# Patient Record
Sex: Male | Born: 1955 | Race: White | Hispanic: No | Smoking: Former smoker
Health system: Southern US, Community
[De-identification: ages and names within clinical notes are randomized; demographics above are authoritative.]

## PROBLEM LIST (undated history)

## (undated) DIAGNOSIS — I10 Essential (primary) hypertension: Secondary | ICD-10-CM

## (undated) DIAGNOSIS — M25519 Pain in unspecified shoulder: Secondary | ICD-10-CM

## (undated) DIAGNOSIS — J449 Chronic obstructive pulmonary disease, unspecified: Secondary | ICD-10-CM

## (undated) DIAGNOSIS — G8929 Other chronic pain: Secondary | ICD-10-CM

## (undated) DIAGNOSIS — M542 Cervicalgia: Secondary | ICD-10-CM

## (undated) DIAGNOSIS — J42 Unspecified chronic bronchitis: Secondary | ICD-10-CM

## (undated) DIAGNOSIS — J439 Emphysema, unspecified: Secondary | ICD-10-CM

## (undated) HISTORY — PX: COLONOSCOPY: SHX174

## (undated) HISTORY — PX: CARDIAC CATHETERIZATION: SHX172

## (undated) HISTORY — PX: HERNIA REPAIR: SHX51

---

## 2002-02-23 ENCOUNTER — Encounter: Payer: Self-pay | Admitting: Emergency Medicine

## 2002-02-23 ENCOUNTER — Emergency Department (HOSPITAL_COMMUNITY): Admission: EM | Admit: 2002-02-23 | Discharge: 2002-02-23 | Payer: Self-pay | Admitting: Emergency Medicine

## 2006-07-09 ENCOUNTER — Emergency Department (HOSPITAL_COMMUNITY): Admission: EM | Admit: 2006-07-09 | Discharge: 2006-07-09 | Payer: Self-pay | Admitting: Emergency Medicine

## 2006-09-17 ENCOUNTER — Emergency Department (HOSPITAL_COMMUNITY): Admission: EM | Admit: 2006-09-17 | Discharge: 2006-09-18 | Payer: Self-pay | Admitting: Emergency Medicine

## 2007-11-09 ENCOUNTER — Emergency Department (HOSPITAL_COMMUNITY): Admission: EM | Admit: 2007-11-09 | Discharge: 2007-11-10 | Payer: Self-pay | Admitting: Emergency Medicine

## 2011-11-26 ENCOUNTER — Emergency Department (HOSPITAL_COMMUNITY): Payer: Self-pay

## 2011-11-26 ENCOUNTER — Encounter: Payer: Self-pay | Admitting: *Deleted

## 2011-11-26 ENCOUNTER — Emergency Department (HOSPITAL_COMMUNITY)
Admission: EM | Admit: 2011-11-26 | Discharge: 2011-11-26 | Disposition: A | Discharge status: Home / Self Care | Payer: Self-pay | Attending: Emergency Medicine | Admitting: Emergency Medicine

## 2011-11-26 DIAGNOSIS — E119 Type 2 diabetes mellitus without complications: Secondary | ICD-10-CM | POA: Insufficient documentation

## 2011-11-26 DIAGNOSIS — J4489 Other specified chronic obstructive pulmonary disease: Secondary | ICD-10-CM | POA: Insufficient documentation

## 2011-11-26 DIAGNOSIS — J40 Bronchitis, not specified as acute or chronic: Secondary | ICD-10-CM | POA: Insufficient documentation

## 2011-11-26 DIAGNOSIS — J449 Chronic obstructive pulmonary disease, unspecified: Secondary | ICD-10-CM

## 2011-11-26 DIAGNOSIS — I10 Essential (primary) hypertension: Secondary | ICD-10-CM | POA: Insufficient documentation

## 2011-11-26 HISTORY — DX: Chronic obstructive pulmonary disease, unspecified: J44.9

## 2011-11-26 HISTORY — DX: Essential (primary) hypertension: I10

## 2011-11-26 HISTORY — DX: Unspecified chronic bronchitis: J42

## 2011-11-26 LAB — GLUCOSE, CAPILLARY: Glucose-Capillary: 132 mg/dL — ABNORMAL HIGH (ref 70–99)

## 2011-11-26 MED ORDER — PREDNISONE 50 MG PO TABS: ORAL_TABLET | ORAL | Status: AC

## 2011-11-26 MED ORDER — ALBUTEROL SULFATE (5 MG/ML) 0.5% IN NEBU
5.0000 mg | INHALATION_SOLUTION | Freq: Once | RESPIRATORY_TRACT | Status: AC
  Administered 2011-11-26: 5 mg via RESPIRATORY_TRACT
  Filled 2011-11-26: qty 1

## 2011-11-26 MED ORDER — PREDNISONE 20 MG PO TABS
60.0000 mg | ORAL_TABLET | Freq: Once | ORAL | Status: AC
  Administered 2011-11-26: 60 mg via ORAL
  Filled 2011-11-26: qty 3

## 2011-11-26 MED ORDER — IPRATROPIUM BROMIDE 0.02 % IN SOLN
0.5000 mg | Freq: Once | RESPIRATORY_TRACT | Status: AC
  Administered 2011-11-26: 0.5 mg via RESPIRATORY_TRACT
  Filled 2011-11-26: qty 2.5

## 2011-11-26 NOTE — ED Provider Notes (Signed)
This chart was scribed for Joya Gaskins, MD by Wallis Mart. The patient was seen in room APA09/APA09 and the patient's care was started at 11:20.   CSN: [id number] Arrival date & time: 11/26/2011  9:41 AM   First MD Initiated Contact with Patient 11/26/11 1050      Chief Complaint  Patient presents with  . Cough    Patient is a 55 y.o. male presenting with cough. The history is provided by the patient.  Cough This is a new problem. The current episode started more than 2 days ago. The problem occurs constantly. The problem has been gradually worsening. The cough is productive of sputum. Associated symptoms include headaches, myalgias, shortness of breath and wheezing. He has tried nothing for the symptoms. His past medical history is significant for bronchitis and COPD.   Ricardo Maldonado is a 55 y.o. male who presents to the Emergency Department complaining of a gradually worsening productive cough that began 4 days ago.  Pt c/o associated chest sorenessand SOB on exertion, loss of appetite and vomiting.  Pt reports subjective on and off fever. Pt c/o generalized myalgia that began one week ago.     Past Medical History  Diagnosis Date  . Diabetes mellitus   . Hypertension   . COPD (chronic obstructive pulmonary disease)   . Bronchitis, chronic   . Emphysema     Past Surgical History  Procedure Date  . Hernia repair   . Cardiac catheterization   . Colonoscopy     History reviewed. No pertinent family history.  History  Substance Use Topics  . Smoking status: Current Everyday Smoker  . Smokeless tobacco: Not on file  . Alcohol Use: No      Review of Systems  Respiratory: Positive for cough, shortness of breath and wheezing.   Musculoskeletal: Positive for myalgias.  Neurological: Positive for headaches.  All other systems reviewed and are negative.    Allergies  Review of patient's allergies indicates no known allergies.  Home Medications   Current  Outpatient Rx  Name Route Sig Dispense Refill  . ATENOLOL 25 MG PO TABS Oral Take 25 mg by mouth daily.      . B COMPLEX-C PO TABS Oral Take 1 tablet by mouth daily.      Marland Kitchen CINNAMON 500 MG PO CAPS Oral Take 500 mg by mouth 2 (two) times daily.      Marland Kitchen COENZYME Q10 30 MG PO CAPS Oral Take 30 mg by mouth 2 (two) times daily.      . CYCLOBENZAPRINE HCL 10 MG PO TABS Oral Take 10 mg by mouth 3 (three) times daily as needed. Muscle spasms     . OMEGA-3 FATTY ACIDS 1000 MG PO CAPS Oral Take 2 g by mouth daily.      Marland Kitchen GLIPIZIDE 10 MG PO TABS Oral Take 10 mg by mouth 2 (two) times daily before a meal.      . HYDROCHLOROTHIAZIDE 25 MG PO TABS Oral Take 25 mg by mouth daily.      Marland Kitchen LORATADINE 10 MG PO TABS Oral Take 10 mg by mouth daily.      Marland Kitchen METFORMIN HCL 500 MG PO TABS Oral Take 500 mg by mouth 2 (two) times daily with a meal.      . PROMETHAZINE HCL 25 MG PO TABS Oral Take 25 mg by mouth every 6 (six) hours as needed. Nausea/vomiting     . RANITIDINE HCL 150 MG PO TABS Oral Take  150 mg by mouth 2 (two) times daily.      Marland Kitchen RESVERATROL 100 MG PO CAPS Oral Take 1 capsule by mouth 2 (two) times daily.      Marland Kitchen SIMVASTATIN 20 MG PO TABS Oral Take 10 mg by mouth at bedtime.      . TERAZOSIN HCL 5 MG PO CAPS Oral Take 5 mg by mouth at bedtime.      Marland Kitchen VITAMIN C 500 MG PO TABS Oral Take 500 mg by mouth daily.      Marland Kitchen VITAMIN E 400 UNITS PO CAPS Oral Take 400 Units by mouth daily.        BP 137/93  Pulse 76  Temp(Src) 98 F (36.7 C) (Oral)  Resp 20  Ht 6\' 4"  (1.93 m)  Wt 250 lb (113.399 kg)  BMI 30.43 kg/m2  SpO2 95%  Physical Exam CONSTITUTIONAL: Well developed/well nourished HEAD AND FACE: Normocephalic/atraumatic EYES: EOMI/PERRL ENMT: Mucous membranes moist NECK: supple no meningeal signs SPINE:entire spine nontender CV: S1/S2 noted, no murmurs/rubs/gallops noted LUNGS:  wheezing bilaterally, no tachypnea ABDOMEN: soft, nontender, no rebound or guarding GU:no cva tenderness NEURO: Pt is  awake/alert, moves all extremitiesx4 EXTREMITIES: pulses normal, full ROM, no edema, ambulates with no trouble SKIN: warm, color normal PSYCH: no abnormalities of mood noted ED Course  Procedures  DIAGNOSTIC STUDIES: Oxygen Saturation is 92% on room air, adequate by my interpretation.    COORDINATION OF CARE:     Labs Reviewed  GLUCOSE, CAPILLARY - Abnormal; Notable for the following:    Glucose-Capillary 132 (*)    All other components within normal limits   Dg Chest 2 View  11/26/2011  *RADIOLOGY REPORT*  Clinical Data: Cough, chest pain and shortness of breath.  CHEST - 2 VIEW  Comparison: None.  Findings: Trachea is midline.  Heart size normal.  Lungs are clear. No pleural fluid.  IMPRESSION: No acute findings.  Original Report Authenticated By: Reyes Ivan, M.D.     Pt had received albuterol prior to my eval He is improved, no tachypnea, sats improved I walked him around ED and no he had no dyspnea I feel he is safe for d/c home.  Likely viral process causing cough/wheezing   MDM  Nursing notes reviewed and considered in documentation xrays reviewed and considered   I personally performed the services described in this documentation, which was scribed in my presence. The recorded information has been reviewed and considered.          Joya Gaskins, MD 11/26/11 [phone]

## 2011-11-26 NOTE — ED Notes (Signed)
Pt c/o cough congestion and sob more than normal. Pt states he has had a fever that comes and goes.

## 2014-06-02 ENCOUNTER — Encounter (HOSPITAL_COMMUNITY): Payer: Self-pay | Admitting: Emergency Medicine

## 2014-06-02 ENCOUNTER — Emergency Department (HOSPITAL_COMMUNITY): Payer: Non-veteran care

## 2014-06-02 ENCOUNTER — Emergency Department (HOSPITAL_COMMUNITY)
Admission: EM | Admit: 2014-06-02 | Discharge: 2014-06-03 | Disposition: A | Discharge status: Home / Self Care | Payer: Non-veteran care | Attending: Emergency Medicine | Admitting: Emergency Medicine

## 2014-06-02 DIAGNOSIS — I10 Essential (primary) hypertension: Secondary | ICD-10-CM | POA: Insufficient documentation

## 2014-06-02 DIAGNOSIS — E119 Type 2 diabetes mellitus without complications: Secondary | ICD-10-CM | POA: Insufficient documentation

## 2014-06-02 DIAGNOSIS — Z87891 Personal history of nicotine dependence: Secondary | ICD-10-CM | POA: Insufficient documentation

## 2014-06-02 DIAGNOSIS — Z79899 Other long term (current) drug therapy: Secondary | ICD-10-CM | POA: Insufficient documentation

## 2014-06-02 DIAGNOSIS — R51 Headache: Secondary | ICD-10-CM | POA: Insufficient documentation

## 2014-06-02 DIAGNOSIS — J438 Other emphysema: Secondary | ICD-10-CM | POA: Insufficient documentation

## 2014-06-02 DIAGNOSIS — R42 Dizziness and giddiness: Secondary | ICD-10-CM | POA: Insufficient documentation

## 2014-06-02 DIAGNOSIS — R112 Nausea with vomiting, unspecified: Secondary | ICD-10-CM | POA: Insufficient documentation

## 2014-06-02 HISTORY — DX: Emphysema, unspecified: J43.9

## 2014-06-02 LAB — CBG MONITORING, ED: GLUCOSE-CAPILLARY: 162 mg/dL — AB (ref 70–99)

## 2014-06-02 MED ORDER — ALBUTEROL SULFATE (2.5 MG/3ML) 0.083% IN NEBU
2.5000 mg | INHALATION_SOLUTION | Freq: Once | RESPIRATORY_TRACT | Status: DC
  Filled 2014-06-02: qty 3

## 2014-06-02 MED ORDER — IPRATROPIUM-ALBUTEROL 0.5-2.5 (3) MG/3ML IN SOLN
3.0000 mL | Freq: Once | RESPIRATORY_TRACT | Status: AC
  Administered 2014-06-02: 3 mL via RESPIRATORY_TRACT

## 2014-06-02 MED ORDER — IPRATROPIUM BROMIDE HFA 17 MCG/ACT IN AERS: 2.0000 | INHALATION_SPRAY | Freq: Once | RESPIRATORY_TRACT | Status: DC

## 2014-06-02 MED ORDER — IPRATROPIUM-ALBUTEROL 0.5-2.5 (3) MG/3ML IN SOLN
RESPIRATORY_TRACT | Status: AC
  Filled 2014-06-02: qty 3

## 2014-06-02 MED ORDER — PROMETHAZINE HCL 12.5 MG PO TABS
25.0000 mg | ORAL_TABLET | Freq: Once | ORAL | Status: AC
  Administered 2014-06-02: 25 mg via ORAL
  Filled 2014-06-02: qty 2

## 2014-06-02 NOTE — ED Notes (Addendum)
Patient complaining of "feeling dizzy and weak" since yesterday with vomiting starting today at 1600. States "I haven't had my diabetes medicine in about a month and a half." CBG - 162 at triage.

## 2014-06-02 NOTE — ED Provider Notes (Signed)
CSN: [id number]     Arrival date & time 06/02/14  2211 History   First MD Initiated Contact with Patient 06/02/14 2241     Chief Complaint  Patient presents with  . Emesis  . Dizziness     (Consider location/radiation/quality/duration/timing/severity/associated sxs/prior Treatment) HPI Comments: Hx of nausea for several years.  Patient is a 58 y.o. male presenting with vomiting and dizziness. The history is provided by the patient.  Emesis Severity:  Moderate Duration:  6 hours Timing:  Intermittent Quality:  Stomach contents Progression:  Worsening Chronicity:  New Recent urination:  Normal Relieved by:  Nothing Worsened by:  Nothing tried Associated symptoms: headaches   Associated symptoms comment:  Sweats Risk factors: diabetes   Risk factors: no alcohol use, no sick contacts, no suspect food intake and no travel to endemic areas   Dizziness Associated symptoms: headaches and vomiting     Past Medical History  Diagnosis Date  . Diabetes mellitus   . Hypertension   . COPD (chronic obstructive pulmonary disease)   . Bronchitis, chronic   . Emphysema   . Emphysema of lung    Past Surgical History  Procedure Laterality Date  . Hernia repair    . Cardiac catheterization    . Colonoscopy     History reviewed. No pertinent family history. History  Substance Use Topics  . Smoking status: Former Games developermoker  . Smokeless tobacco: Not on file  . Alcohol Use: No    Review of Systems  Gastrointestinal: Positive for vomiting.  Neurological: Positive for dizziness and headaches.      Allergies  Review of patient's allergies indicates no known allergies.  Home Medications   Prior to Admission medications   Medication Sig Start Date End Date Taking? Authorizing Provider  atenolol (TENORMIN) 25 MG tablet Take 25 mg by mouth daily.      Historical Provider, MD  B Complex-C (B-COMPLEX WITH VITAMIN C) tablet Take 1 tablet by mouth daily.      Historical Provider, MD   Cinnamon 500 MG capsule Take 500 mg by mouth 2 (two) times daily.      Historical Provider, MD  co-enzyme Q-10 30 MG capsule Take 30 mg by mouth 2 (two) times daily.      Historical Provider, MD  cyclobenzaprine (FLEXERIL) 10 MG tablet Take 10 mg by mouth 3 (three) times daily as needed. Muscle spasms     Historical Provider, MD  fish oil-omega-3 fatty acids 1000 MG capsule Take 2 g by mouth daily.      Historical Provider, MD  glipiZIDE (GLUCOTROL) 10 MG tablet Take 10 mg by mouth 2 (two) times daily before a meal.      Historical Provider, MD  hydrochlorothiazide (HYDRODIURIL) 25 MG tablet Take 25 mg by mouth daily.      Historical Provider, MD  loratadine (CLARITIN) 10 MG tablet Take 10 mg by mouth daily.      Historical Provider, MD  metFORMIN (GLUCOPHAGE) 500 MG tablet Take 500 mg by mouth 2 (two) times daily with a meal.      Historical Provider, MD  promethazine (PHENERGAN) 25 MG tablet Take 25 mg by mouth every 6 (six) hours as needed. Nausea/vomiting     Historical Provider, MD  ranitidine (ZANTAC) 150 MG tablet Take 150 mg by mouth 2 (two) times daily.      Historical Provider, MD  RESVERATROL 100 MG CAPS Take 1 capsule by mouth 2 (two) times daily.      Historical Provider,  MD  simvastatin (ZOCOR) 20 MG tablet Take 10 mg by mouth at bedtime.      Historical Provider, MD  terazosin (HYTRIN) 5 MG capsule Take 5 mg by mouth at bedtime.      Historical Provider, MD  vitamin C (ASCORBIC ACID) 500 MG tablet Take 500 mg by mouth daily.      Historical Provider, MD  vitamin E 400 UNIT capsule Take 400 Units by mouth daily.      Historical Provider, MD   BP 160/94  Pulse 62  Temp(Src) 97.6 F (36.4 C) (Oral)  Ht 6\' 4"  (1.93 m)  Wt 245 lb (111.131 kg)  BMI 29.83 kg/m2  SpO2 97% Physical Exam  Nursing note and vitals reviewed. Constitutional: He is oriented to person, place, and time. He appears well-developed and well-nourished.  Non-toxic appearance.  HENT:  Head: Normocephalic.   Right Ear: Tympanic membrane and external ear normal.  Left Ear: Tympanic membrane and external ear normal.  Patient has difficulty hearing. He uses hearing aids.  Eyes: EOM and lids are normal. Pupils are equal, round, and reactive to light.  Neck: Normal range of motion. Neck supple. Carotid bruit is not present.  Cardiovascular: Normal rate, regular rhythm, normal heart sounds, intact distal pulses and normal pulses.   Pulmonary/Chest: Breath sounds normal. No respiratory distress.  Rhonchi and soft wheezes present.  Abdominal: Soft. Bowel sounds are normal. He exhibits no mass. There is no tenderness. There is no rebound and no guarding.  Musculoskeletal: Normal range of motion.  Lymphadenopathy:       Head (right side): No submandibular adenopathy present.       Head (left side): No submandibular adenopathy present.    He has no cervical adenopathy.  Neurological: He is alert and oriented to person, place, and time. He has normal strength. No cranial nerve deficit or sensory deficit.  Skin: Skin is warm and dry.  Psychiatric: He has a normal mood and affect. His speech is normal.    ED Course  Procedures (including critical care time) Labs Review Labs Reviewed  CBG MONITORING, ED - Abnormal; Notable for the following:    Glucose-Capillary 162 (*)    All other components within normal limits    Imaging Review No results found.   EKG Interpretation None      MDM Vital signs are within normal limits with exception of the blood pressure being elevated at 160/94. Pulse oximetry is 97% on room air. The patient had some rhonchus sounding breath sounds as well as a few soft wheezes, this was improved with albuterol Atrovent nebulizer treatment here in the department.  Comprehensive metabolic panel is well within normal limits. Complete blood count is well within normal limits. Lipase is normal. Troponin is negative for acute event. Acute abdomen x-ray shows no significant bowel  gas pattern. There is no free intra-abdominal air seen. There is a moderate amount of stool throughout the colon. There is a question of a 1.1 cm nodular density in the right midlung zone. Outpatient CT has been suggested. I discussed this with the patient. [facility], and will discuss this with them. Prescription for promethazine given to the patient.    Final diagnoses:  None    *I have reviewed nursing notes, vital signs, and all appropriate lab and imaging results for this patient.Kathie Dike**    Glendel Jaggers M Ader Fritze, PA-C 06/03/14 0124

## 2014-06-03 LAB — CBC WITH DIFFERENTIAL/PLATELET
BASOS ABS: 0 10*3/uL (ref 0.0–0.1)
BASOS PCT: 1 % (ref 0–1)
EOS PCT: 3 % (ref 0–5)
Eosinophils Absolute: 0.3 10*3/uL (ref 0.0–0.7)
HEMATOCRIT: 46.6 % (ref 39.0–52.0)
Hemoglobin: 16.9 g/dL (ref 13.0–17.0)
Lymphocytes Relative: 20 % (ref 12–46)
Lymphs Abs: 1.7 10*3/uL (ref 0.7–4.0)
MCH: 32.2 pg (ref 26.0–34.0)
MCHC: 36.3 g/dL — AB (ref 30.0–36.0)
MCV: 88.8 fL (ref 78.0–100.0)
MONO ABS: 0.7 10*3/uL (ref 0.1–1.0)
Monocytes Relative: 8 % (ref 3–12)
Neutro Abs: 5.9 10*3/uL (ref 1.7–7.7)
Neutrophils Relative %: 68 % (ref 43–77)
Platelets: 175 10*3/uL (ref 150–400)
RBC: 5.25 MIL/uL (ref 4.22–5.81)
RDW: 12.3 % (ref 11.5–15.5)
WBC: 8.6 10*3/uL (ref 4.0–10.5)

## 2014-06-03 LAB — COMPREHENSIVE METABOLIC PANEL
ALBUMIN: 4 g/dL (ref 3.5–5.2)
ALK PHOS: 60 U/L (ref 39–117)
ALT: 15 U/L (ref 0–53)
AST: 21 U/L (ref 0–37)
BILIRUBIN TOTAL: 1 mg/dL (ref 0.3–1.2)
BUN: 12 mg/dL (ref 6–23)
CHLORIDE: 97 meq/L (ref 96–112)
CO2: 26 mEq/L (ref 19–32)
Calcium: 9.2 mg/dL (ref 8.4–10.5)
Creatinine, Ser: 0.79 mg/dL (ref 0.50–1.35)
GFR calc Af Amer: >90 mL/min (ref >90)
GFR calc non Af Amer: >90 mL/min (ref >90)
Glucose, Bld: 151 mg/dL — ABNORMAL HIGH (ref 70–99)
POTASSIUM: 3.8 meq/L (ref 3.7–5.3)
Sodium: 137 mEq/L (ref 137–147)
Total Protein: 7.1 g/dL (ref 6.0–8.3)

## 2014-06-03 LAB — LIPASE, BLOOD: Lipase: 31 U/L (ref 11–59)

## 2014-06-03 LAB — TROPONIN I: Troponin I: <0.3 ng/mL (ref <0.30)

## 2014-06-03 MED ORDER — PROMETHAZINE HCL 25 MG RE SUPP: 25.0000 mg | Freq: Four times a day (QID) | RECTAL | Status: DC | PRN

## 2014-06-03 NOTE — Discharge Instructions (Signed)
Your labs are negative for acute problem. Your x-ray shows some increase stool in your colon, but no emergent or surgical issue. Please increase fluids, please use stool softener of your choice, please use promethazine for nausea. [facility] and management of your vomiting. Your chest x-ray tonight raises a question of a questionable shadow in the right lung area. The radiologist has suggested a CT scan of your chest as an outpatient. [facility]

## 2014-06-03 NOTE — ED Provider Notes (Signed)
Medical screening examination/treatment/procedure(s) were performed by non-physician practitioner and as supervising physician I was immediately available for consultation/collaboration.   EKG Interpretation   Date/Time:  Friday June 02 2014 23:42:54 EDT Ventricular Rate:  54 PR Interval:  139 QRS Duration: 102 QT Interval:  528 QTC Calculation: 500 R Axis:   -28 Text Interpretation:  Sinus rhythm Borderline left axis deviation Abnormal  R-wave progression, early transition Borderline T abnormalities, inferior  leads Prolonged QT interval No previous ECGs available Confirmed by  Bebe ShaggyWICKLINE  MD, DONALD ([id number]) on 06/02/2014 11:54:40 PM        [name], MD 06/03/14 1756

## 2016-01-22 ENCOUNTER — Ambulatory Visit (HOSPITAL_COMMUNITY): Payer: No Typology Code available for payment source | Attending: Internal Medicine | Admitting: Physical Therapy

## 2016-01-22 DIAGNOSIS — M542 Cervicalgia: Secondary | ICD-10-CM | POA: Diagnosis present

## 2016-01-22 DIAGNOSIS — M25612 Stiffness of left shoulder, not elsewhere classified: Secondary | ICD-10-CM | POA: Diagnosis present

## 2016-01-22 DIAGNOSIS — M25512 Pain in left shoulder: Secondary | ICD-10-CM

## 2016-01-22 DIAGNOSIS — R29898 Other symptoms and signs involving the musculoskeletal system: Secondary | ICD-10-CM | POA: Diagnosis present

## 2016-01-22 NOTE — Patient Instructions (Addendum)
ROM: Flexion - Wand (Supine)    Lie on back holding wand. Raise arms over head.  Repeat __10__ times per set. Do ___1_ sets per session. Do _2-3___ sessions per day.  http://orth.exer.us/928   Copyright  VHI. All rights reserved.  ROM: Abduction - Wand    Holding wand with left hand palm up, push wand directly out to side, leading with other hand palm down, until stretch is felt. Hold __5__ seconds. Repeat __10__ times per set. Do _1___ sets per session. Do __2-3__ sessions per day.  http://orth.exer.us/746   Copyright  VHI. All rights reserved.  Progressive Resisted: External Rotation (Side-Lying)    Holding __0__ pound weight, towel under arm, raise right forearm toward ceiling. Keep elbow bent and at side. Repeat ____10 times per set. Do _1___ sets per session. Do ____2-3 sessions per day.  http://orth.exer.us/878   Copyright  VHI. All rights reserved.  Scapular Retraction (Standing)    With arms at sides, pinch shoulder blades together. Repeat 10____ times per set. Do _1___ sets per session. Do2 ____ sessions per day.  http://orth.exer.us/944   Copyright  VHI. All rights reserved.  ROM: Pendulum (Flexion / Extension)    Let right arm hang and use momentum from body to swing arm forward and back. Progress from small to larger swings. Repeat _10___ times per set. Do _1___ sets per session. Do _2___ sessions per day.  http://orth.exer.us/936   Copyright  VHI. All rights reserved.  ROM: Pendulum (Side-to-Side)    Let right arm swing freely from side to side by rocking body weight from side to side. Repeat __10__ times per set. Do __1__ sets per session. Do 2____ sessions per day.  http://orth.exer.us/792   Copyright  VHI. All rights reserved.
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
## 2016-01-24 ENCOUNTER — Ambulatory Visit (HOSPITAL_COMMUNITY): Payer: No Typology Code available for payment source | Admitting: Physical Therapy

## 2016-01-24 DIAGNOSIS — R29898 Other symptoms and signs involving the musculoskeletal system: Secondary | ICD-10-CM

## 2016-01-24 DIAGNOSIS — M542 Cervicalgia: Secondary | ICD-10-CM

## 2016-01-24 DIAGNOSIS — M25612 Stiffness of left shoulder, not elsewhere classified: Secondary | ICD-10-CM

## 2016-01-24 DIAGNOSIS — M25512 Pain in left shoulder: Secondary | ICD-10-CM | POA: Diagnosis not present

## 2016-01-24 NOTE — Therapy (Signed)
[facility] [address] [place], [place], [address] Phone: [phone]   Fax:  [phone]  Physical Therapy Treatment  Patient Details  Name: Ricardo Maldonado MRN: [id number] Date of Birth: 1956/11/27 Referring Provider: Serafina Royals  Encounter Date: 01/24/2016      PT End of Session - 01/24/16 1044    Visit Number 2   Number of Visits 12   Date for PT Re-Evaluation 02/21/16   Authorization Type [facility]   PT Start Time 1020   PT Stop Time 1105   PT Time Calculation (min) 45 min   Activity Tolerance Patient limited by pain   Behavior During Therapy [facility] for tasks assessed/performed      Past Medical History  Diagnosis Date  . Diabetes mellitus   . Hypertension   . COPD (chronic obstructive pulmonary disease)   . Bronchitis, chronic   . Emphysema   . Emphysema of lung     Past Surgical History  Procedure Laterality Date  . Hernia repair    . Cardiac catheterization    . Colonoscopy      There were no vitals filed for this visit.  Visit Diagnosis:  Pain in joint of left shoulder  Shoulder stiffness, left  Shoulder weakness  Neck pain on left side      Subjective Assessment - 01/24/16 1030    Subjective Patient states that he his doing his exercises.  A little sore but has no questions.    Currently in Pain? Yes   Pain Score 8    Pain Location Shoulder   Pain Orientation Left   Pain Descriptors / Indicators Aching;Throbbing   Pain Type Chronic pain   Pain Radiating Towards elbow    Pain Onset More than a month ago   Pain Frequency Constant   Aggravating Factors  activity   Pain Relieving Factors medication   Multiple Pain Sites Yes   Pain Score 2   Pain Location Neck   Pain Descriptors / Indicators Aching   Pain Onset More than a month ago   Pain Frequency Constant   Aggravating Factors  moving    Pain Relieving Factors using the portable traction                          [facility] Adult PT  Treatment/Exercise - 01/24/16 0001    Exercises   Exercises Shoulder   Shoulder Exercises: Supine   External Rotation AROM;Left;10 reps;Weights   External Rotation Weight (lbs) 2   Internal Rotation Left;10 reps;Weights   Internal Rotation Weight (lbs) 2   Flexion AAROM;Left;10 reps  wand   ABduction AAROM;Left;10 reps  wand    Other Supine Exercises PROM for all motions    Shoulder Exercises: Seated   Retraction Strengthening;Both;10 reps   Other Seated Exercises cervical isometric exercises x 5    Shoulder Exercises: Sidelying   External Rotation Strengthening;Left;10 reps   Shoulder Exercises: Standing   Other Standing Exercises codmans x 5    Other Standing Exercises isometric for shoulder flexion, External rotation, abduction x 10 each    Shoulder Exercises: ROM/Strengthening   UBE (Upper Arm Bike) 4' backward            PT Education - 01/24/16 1105    Education provided Yes   Education Details for isometric exercises    Person(s) Educated Patient   Methods Explanation;Handout   Comprehension Returned demonstration  PT Short Term Goals - 01/24/16 1049    PT SHORT TERM GOAL #1   Title Pt pain in his shoulder to be no greater than a 5/10 to be able to lift his arm to shoulder height and perform job duties.    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    Time 3   Period Weeks   Status On-going   PT SHORT TERM GOAL #3   Title Pt active ROM for abduction to be to 90 degrees to make getting in and out of shirts easier.    Time 3   Period Weeks   Status On-going   PT SHORT TERM GOAL #4   Title Pt cervical ROM improved by 10 degres to allow safer driving    Time 3   Status On-going           PT Long Term Goals - 01/24/16 1049    PT LONG TERM  GOAL #1   Title Pt pain level to be no greater than a 3/10 to allow pt to complete all work duties including without having to rest.    Time 6   Period Weeks   Status On-going   PT LONG TERM GOAL #2   Title Pt ROM to be functional (standing sholulder flexion and abduction to be at 165) to allow pt to bath under his arm without difficulty, place items such as plates in higher cabinets without difficulty.    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    Time 6   Period Weeks   Status On-going   PT LONG TERM GOAL #4   Title Pt to be able to turn his head to the left with 20 more degrees to improve safety of driving    Time 6   Period Weeks   Status On-going   PT LONG TERM GOAL #5   Title Pt to be completing an advance home exercise to be able to obtain goals in a more expedient manner.    Time 6   Period Weeks   Status On-going               Plan - 01/24/16 1044    Clinical Impression Statement Reviewed initial evaluation and HEP with patient with no questions or concerns. Pt instructed in isometric exercises as well as sidelying ER. Began PROM to improve ROM and prevent stiffness of patient's shoulder.    Pt will benefit from skilled therapeutic intervention in order to improve on the following deficits Decreased activity tolerance;Decreased endurance;Decreased range of motion;Decreased strength;Impaired flexibility;Pain   PT Frequency 2x / week   PT Duration 6 weeks   PT Treatment/Interventions ADLs/Self Care Home Management;Electrical Stimulation;Ultrasound;Therapeutic exercise;Therapeutic activities;Functional mobility training;Patient/family education;Passive range of motion   PT Next Visit Plan Continue with PROM and progressive strengthening         Problem List There are no active problems to display for this  patient.   Virgina Organ, PT CLT [phone] 01/24/2016, 11:06 AM  [facility] [address] [place], [place], [address] Phone: [phone]   Fax:  [phone]  Name: Ricardo Maldonado MRN: [id number] Date of Birth: Dec 24, 1955

## 2016-01-24 NOTE — Patient Instructions (Signed)
Strengthening: Isometric Abduction    Using wall for resistance, press left arm into ball using light pressure. Hold _5___ seconds. Repeat __10__ times per set. Do _1___ sets per session. Do _2___ sessions per day.  http://orth.exer.us/806   Copyright  VHI. All rights reserved.  Strengthening: Isometric External Rotation    Using wall to provide resistance, and keeping right arm at side, press back of hand into ball using light pressure. Hold ____ seconds. Repeat __10__ times per set. Do _1___ sets per session. Do __2__ sessions per day.  http://orth.exer.us/814   Copyright  VHI. All rights reserved.  Strengthening: Isometric Flexion    Using wall for resistance, press right fist into ball using light pressure. Hold 5____ seconds. Repeat __10__ times per set. Do __1__ sets per session. Do ____2 sessions per day.  http://orth.exer.us/800   Copyright  VHI. All rights reserved.  Strengthening: Lateral Bend - Isometric (in Neutral)    Using light pressure from fingertips, press into right temple. Resist bending head sideways. Hold ___5_ seconds. Repeat __10__ times per set. Do ___1_ sets per session. Do _2___ sessions per day.  http://orth.exer.us/302   Copyright  VHI. All rights reserved.  Strengthening: Extension - Isometric (in Neutral)    Using light pressure from fingertips at back of head, resist bending head backward. Hold __5__ seconds. Repeat _10___ times per set. Do __1__ sets per session. Do __2__ sessions per day. 1 http://orth.exer.us/308   Copyright  VHI. All rights reserved.

## 2016-02-04 ENCOUNTER — Telehealth (HOSPITAL_COMMUNITY): Payer: Self-pay | Admitting: Physical Therapy

## 2016-02-05 ENCOUNTER — Ambulatory Visit (HOSPITAL_COMMUNITY): Payer: No Typology Code available for payment source | Admitting: Physical Therapy

## 2016-02-05 DIAGNOSIS — M25512 Pain in left shoulder: Secondary | ICD-10-CM | POA: Diagnosis not present

## 2016-02-05 DIAGNOSIS — R29898 Other symptoms and signs involving the musculoskeletal system: Secondary | ICD-10-CM

## 2016-02-05 DIAGNOSIS — M25612 Stiffness of left shoulder, not elsewhere classified: Secondary | ICD-10-CM

## 2016-02-05 DIAGNOSIS — M542 Cervicalgia: Secondary | ICD-10-CM

## 2016-02-05 NOTE — Therapy (Signed)
[facility] [address] [place], [place], [address] Phone: [phone]   Fax:  [phone]  Physical Therapy Treatment  Patient Details  Name: Ricardo Maldonado MRN: [id number] Date of Birth: 07/19/1956 Referring Provider: Serafina Royals  Encounter Date: 02/05/2016      PT End of Session - 02/05/16 1755    Visit Number 3   Number of Visits 12   Date for PT Re-Evaluation 02/21/16   Authorization Type VA   PT Start Time 1650   PT Stop Time 1735   PT Time Calculation (min) 45 min   Activity Tolerance Patient limited by pain   Behavior During Therapy [facility] for tasks assessed/performed      Past Medical History  Diagnosis Date  . Diabetes mellitus   . Hypertension   . COPD (chronic obstructive pulmonary disease)   . Bronchitis, chronic   . Emphysema   . Emphysema of lung     Past Surgical History  Procedure Laterality Date  . Hernia repair    . Cardiac catheterization    . Colonoscopy      There were no vitals filed for this visit.  Visit Diagnosis:  Pain in joint of left shoulder  Shoulder stiffness, left  Neck pain on left side  Shoulder weakness      Subjective Assessment - 02/05/16 1700    Subjective Pt states he's been working in his yard, using the hedgetrimmers and other stuff for approx 3.5 hours.  States he is currently in 8/10 pain in his Lt shoulder.    Currently in Pain? Yes   Pain Score 8    Pain Location Shoulder   Pain Orientation Left   Pain Descriptors / Indicators Aching;Throbbing                         [facility] Adult PT Treatment/Exercise - 02/05/16 1703    Shoulder Exercises: Supine   External Rotation AROM;Left;10 reps;Weights   Flexion AAROM;Left;10 reps  wand   Shoulder Flexion Weight (lbs) 3   ABduction AAROM;Left;10 reps  wand   Shoulder ABduction Weight (lbs) 3   Shoulder Exercises: Seated   Retraction Strengthening;Both;10 reps   Shoulder Exercises: Sidelying   External Rotation Strengthening;Left;10 reps   ABduction Left;5 reps   Shoulder Exercises: Pulleys   Flexion 1 minute   ABduction 1 minute   Shoulder Exercises: Therapy Ball   Flexion 10 reps   Right/Left 10 reps   Shoulder Exercises: ROM/Strengthening   UBE (Upper Arm Bike) 4' backward    Manual Therapy   Manual Therapy Scapular mobilization   Manual therapy comments Rt sidelying to Lt scap   Scapular Mobilization A/P and lateral                  PT Short Term Goals - 01/24/16 1049    PT SHORT TERM GOAL #1   Title Pt pain in his shoulder to be no greater than a 5/10 to be able to lift his arm to shoulder height and perform job duties.    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    Time 3   Period Weeks   Status On-going  PT SHORT TERM GOAL #3   Title Pt active ROM for abduction to be to 90 degrees to make getting in and out of shirts easier.    Time 3   Period Weeks   Status On-going   PT SHORT TERM GOAL #4   Title Pt cervical ROM improved by 10 degres to allow safer driving    Time 3   Status On-going           PT Long Term Goals - 01/24/16 1049    PT LONG TERM GOAL #1   Title Pt pain level to be no greater than a 3/10 to allow pt to complete all work duties including without having to rest.    Time 6   Period Weeks   Status On-going   PT LONG TERM GOAL #2   Title Pt ROM to be functional (standing sholulder flexion and abduction to be at 165) to allow pt to bath under his arm without difficulty, place items such as plates in higher cabinets without difficulty.    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    Time 6   Period Weeks   Status On-going   PT LONG TERM GOAL #4   Title Pt to be able to turn his head to the left with 20 more degrees to improve safety of driving    Time 6   Period Weeks   Status On-going   PT LONG TERM GOAL #5   Title Pt to be completing an advance home exercise to be able to obtain goals in a more expedient manner.    Time 6   Period Weeks   Status On-going               Plan - 02/05/16 1756    Clinical Impression Statement Continued focus on improving pain free ROM.  Pt tends to push self outside of pain free zone.  Discussed importance of beginning slower and building and also with the physical labor completed at home that iincreased his pain today.  Pt with pain verbalizations throughout session and sometimes would state it felt better when questioned despite the obvious pain behaviors.  Added pullies with full ROM completed and ball stretches in seated position. Scapular mobs completed in sidelying at end of session to increase comfort.  Pt reported overall improvment with reduced symptoms at end of session by 2 levels.     Pt will benefit from skilled therapeutic intervention in order to improve on the following deficits Decreased activity tolerance;Decreased endurance;Decreased range of motion;Decreased strength;Impaired flexibility;Pain   PT Frequency 2x / week   PT Duration 6 weeks   PT Treatment/Interventions ADLs/Self Care Home Management;Electrical Stimulation;Ultrasound;Therapeutic exercise;Therapeutic activities;Functional mobility training;Patient/family education;Passive range of motion   PT Next Visit Plan Continue with PROM and progressive strengthening         Problem List There are no active problems to display for this patient.   Lurena Nida, PTA/CLT [phone]  02/05/2016, 6:00 PM  [facility] [address] [place], [place], [address] Phone: [phone]   Fax:   [phone]  Name: Ricardo Maldonado MRN: [id number] Date of Birth: [birth year]

## 2016-02-07 ENCOUNTER — Ambulatory Visit (HOSPITAL_COMMUNITY): Payer: No Typology Code available for payment source | Admitting: Physical Therapy

## 2016-02-07 DIAGNOSIS — M25512 Pain in left shoulder: Secondary | ICD-10-CM | POA: Diagnosis not present

## 2016-02-07 DIAGNOSIS — R29898 Other symptoms and signs involving the musculoskeletal system: Secondary | ICD-10-CM

## 2016-02-07 DIAGNOSIS — M25612 Stiffness of left shoulder, not elsewhere classified: Secondary | ICD-10-CM

## 2016-02-07 DIAGNOSIS — M542 Cervicalgia: Secondary | ICD-10-CM

## 2016-02-07 NOTE — Therapy (Signed)
[facility] [address] [place], [place], [address] Phone: [phone]   Fax:  [phone]  Physical Therapy Treatment  Patient Details  Name: Ricardo Maldonado MRN: [id number] Date of Birth: 1956/02/11 Referring Provider: Serafina Royals  Encounter Date: 02/07/2016      PT End of Session - 02/07/16 1821    Visit Number 4   Number of Visits 12   Date for PT Re-Evaluation 02/21/16   Authorization Type VA   PT Start Time 1600   PT Stop Time 1645   PT Time Calculation (min) 45 min   Activity Tolerance Patient limited by pain   Behavior During Therapy [facility] for tasks assessed/performed      Past Medical History  Diagnosis Date  . Diabetes mellitus   . Hypertension   . COPD (chronic obstructive pulmonary disease)   . Bronchitis, chronic   . Emphysema   . Emphysema of lung     Past Surgical History  Procedure Laterality Date  . Hernia repair    . Cardiac catheterization    . Colonoscopy      There were no vitals filed for this visit.  Visit Diagnosis:  Pain in joint of left shoulder  Shoulder stiffness, left  Shoulder weakness  Neck pain on left side      Subjective Assessment - 02/07/16 1611    Subjective Pt states his pain is worse first thing in the morning, 9-10/10 but goes down slightly during the day.  Currently 8/10; states he took it easy today.    Currently in Pain? Yes   Pain Score 8    Pain Location Shoulder   Pain Orientation Left   Pain Descriptors / Indicators Aching;Throbbing   Pain Type Chronic pain                         [facility] Adult PT Treatment/Exercise - 02/07/16 1618    Shoulder Exercises: Supine   Internal Rotation Left;10 reps;Weights   Internal Rotation Weight (lbs) 2   Flexion Left;10 reps;AROM   Flexion Limitations AROM without wand, Lt only   Shoulder Exercises: Seated   Retraction Strengthening;Both;10 reps   Flexion 10 reps   Flexion Limitations against wall   Shoulder Exercises: Sidelying   External Rotation Strengthening;Left;10 reps   External Rotation Weight (lbs) 3   ABduction Left;10 reps   Shoulder Exercises: Pulleys   Flexion 2 minutes   ABduction 2 minutes   Shoulder Exercises: Therapy Ball   Flexion 10 reps   Right/Left 10 reps   Shoulder Exercises: ROM/Strengthening   UBE (Upper Arm Bike) 4' backward    Shoulder Exercises: Stretch   Corner Stretch 3 reps;20 seconds   Manual Therapy   Manual Therapy Scapular mobilization   Manual therapy comments Rt sidelying to Lt scap   Scapular Mobilization A/P and lateral                  PT Short Term Goals - 01/24/16 1049    PT SHORT TERM GOAL #1   Title Pt pain in his shoulder to be no greater than a 5/10 to be able to lift his arm to shoulder height and perform job duties.    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    Time 3   Period Weeks   Status On-going   PT SHORT TERM GOAL #3   Title Pt active ROM for abduction to be to 90 degrees to make getting in and out of shirts easier.    Time 3   Period Weeks   Status On-going   PT SHORT TERM GOAL #4   Title Pt cervical ROM improved by 10 degres to allow safer driving    Time 3   Status On-going           PT Long Term Goals - 01/24/16 1049    PT LONG TERM GOAL #1   Title Pt pain level to be no greater than a 3/10 to allow pt to complete all work duties including without having to rest.    Time 6   Period Weeks   Status On-going   PT LONG TERM GOAL #2   Title Pt ROM to be functional (standing sholulder flexion and abduction to be at 165) to allow pt to bath under his arm without difficulty, place items such as plates in higher cabinets without difficulty.    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    Time 6   Period Weeks   Status On-going   PT LONG TERM GOAL #4   Title Pt to be able to turn his head to the left with 20 more degrees to improve safety of driving    Time 6   Period Weeks   Status On-going   PT LONG TERM GOAL #5   Title Pt to be completing an advance home exercise to be able to obtain goals in a more expedient manner.    Time 6   Period Weeks   Status On-going               Plan - 02/07/16 1821    Clinical Impression Statement Focused session on advancing to Gravity resisted AROM today.  Added standing UE flexion and abuction wtihout diffiuclty.  discontinued wand in supine and began AROM for flexion and abduction and addition of 3#weight for ER.  pt able to complete all without c/o pain.  Pt now presents with full ROM and is ready to advance to strengthening exercises.   Scapular mobs completed with good mobility as well.  Tightness in upper trap region on Lt noted.    Pt will benefit from skilled therapeutic intervention in order to improve on the following deficits Decreased activity tolerance;Decreased endurance;Decreased range of motion;Decreased strength;Impaired flexibility;Pain   PT Frequency 2x / week   PT Duration 6 weeks   PT Treatment/Interventions ADLs/Self Care Home Management;Electrical Stimulation;Ultrasound;Therapeutic exercise;Therapeutic activities;Functional mobility training;Patient/family education;Passive range of motion   PT Next Visit Plan Continue with AROM and progressive strengthening of UE.         Problem List There are no active problems to display for this patient.   Lurena Nida, PTA/CLT [phone]  02/07/2016, 6:25 PM  [facility] [address] [place], [place], [address] Phone: [phone]   Fax:  [phone]  Name: Ricardo Maldonado MRN: [id number] Date of Birth: 05-31-1956

## 2016-02-12 ENCOUNTER — Ambulatory Visit (HOSPITAL_COMMUNITY): Payer: No Typology Code available for payment source

## 2016-02-14 ENCOUNTER — Ambulatory Visit (HOSPITAL_COMMUNITY): Payer: No Typology Code available for payment source | Admitting: Physical Therapy

## 2016-02-19 ENCOUNTER — Ambulatory Visit (HOSPITAL_COMMUNITY): Payer: No Typology Code available for payment source | Attending: Internal Medicine | Admitting: Physical Therapy

## 2016-02-19 DIAGNOSIS — M25612 Stiffness of left shoulder, not elsewhere classified: Secondary | ICD-10-CM | POA: Insufficient documentation

## 2016-02-19 DIAGNOSIS — M542 Cervicalgia: Secondary | ICD-10-CM | POA: Diagnosis present

## 2016-02-19 DIAGNOSIS — R29898 Other symptoms and signs involving the musculoskeletal system: Secondary | ICD-10-CM | POA: Insufficient documentation

## 2016-02-19 DIAGNOSIS — M25512 Pain in left shoulder: Secondary | ICD-10-CM | POA: Diagnosis not present

## 2016-02-19 NOTE — Therapy (Signed)
[facility] [address] [place], [place], [address] Phone: [phone]   Fax:  [phone]  Physical Therapy Treatment  Patient Details  Name: Ricardo Maldonado MRN: [id number] Date of Birth: 01-17-1956 Referring Provider: Serafina Royals  Encounter Date: 02/19/2016      PT End of Session - 02/19/16 1041    Visit Number 5   Number of Visits 12   Date for PT Re-Evaluation 02/21/16   Authorization Type VA   PT Start Time 1020   PT Stop Time 1100   PT Time Calculation (min) 40 min   Activity Tolerance Patient limited by pain   Behavior During Therapy [facility] for tasks assessed/performed      Past Medical History  Diagnosis Date  . Diabetes mellitus   . Hypertension   . COPD (chronic obstructive pulmonary disease)   . Bronchitis, chronic   . Emphysema   . Emphysema of lung     Past Surgical History  Procedure Laterality Date  . Hernia repair    . Cardiac catheterization    . Colonoscopy      There were no vitals filed for this visit.  Visit Diagnosis:  Pain in joint of left shoulder  Shoulder stiffness, left  Shoulder weakness  Neck pain on left side      Subjective Assessment - 02/19/16 1024    Subjective Pt states he didn't sleep much last night due to the pain in his shoulder and neck. Pain is 7/10 upon arrival, but improves with movement.    Currently in Pain? Yes   Pain Score 7    Pain Location Neck   Pain Orientation Left   Pain Descriptors / Indicators Aching;Throbbing   Pain Type Chronic pain   Pain Radiating Towards deltoid region    Pain Onset More than a month ago   Pain Frequency Constant   Aggravating Factors  activity, LUE elevation   Pain Relieving Factors PT alleviates some of the pain for several hours    Effect of Pain on Daily Activities all over head activities                         [facility] Adult PT Treatment/Exercise - 02/19/16 1038    Shoulder Exercises: Supine   Internal  Rotation Left;10 reps;Weights   Internal Rotation Weight (lbs) 2   Flexion Left;10 reps;AROM   Flexion Limitations AROM without wand, Lt only   Shoulder Exercises: Pulleys   Flexion 2 minutes   ABduction 2 minutes   Shoulder Exercises: Therapy Ball   Flexion 10 reps   Right/Left 10 reps   Shoulder Exercises: ROM/Strengthening   UBE (Upper Arm Bike) 4' backward                 PT Education - 02/19/16 1056    Education provided Yes   Education Details Completing therex in decreased pain as far as ROM and pushing to extreme pain.   Person(s) Educated Patient   Methods Explanation   Comprehension Verbalized understanding          PT Short Term Goals - 01/24/16 1049    PT SHORT TERM GOAL #1   Title Pt pain in his shoulder to be no greater than a 5/10 to be able to lift his arm to shoulder height and perform job duties.    Time 3   Period Weeks   Status On-going   PT SHORT TERM GOAL #2  Title Pt strength to be improved  1/2 grade to be able to lift five pounds to shoulder height to be able to put a gallon of milk into the refridgerator and various work activites.    Time 3   Period Weeks   Status On-going   PT SHORT TERM GOAL #3   Title Pt active ROM for abduction to be to 90 degrees to make getting in and out of shirts easier.    Time 3   Period Weeks   Status On-going   PT SHORT TERM GOAL #4   Title Pt cervical ROM improved by 10 degres to allow safer driving    Time 3   Status On-going           PT Long Term Goals - 01/24/16 1049    PT LONG TERM GOAL #1   Title Pt pain level to be no greater than a 3/10 to allow pt to complete all work duties including without having to rest.    Time 6   Period Weeks   Status On-going   PT LONG TERM GOAL #2   Title Pt ROM to be functional (standing sholulder flexion and abduction to be at 165) to allow pt to bath under his arm without difficulty, place items such as plates in higher cabinets without difficulty.     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    Time 6   Period Weeks   Status On-going   PT LONG TERM GOAL #4   Title Pt to be able to turn his head to the left with 20 more degrees to improve safety of driving    Time 6   Period Weeks   Status On-going   PT LONG TERM GOAL #5   Title Pt to be completing an advance home exercise to be able to obtain goals in a more expedient manner.    Time 6   Period Weeks   Status On-going               Plan - 02/19/16 1041    Clinical Impression Statement Pt has not had therapy in over 1 week due to appt unavailable and patient getting appt mixed up.  STates he was suppose to return to [place] yesterday but had to cancel due to stomach virus.  Pt reports no change in symptoms (no better/no worse).  Reports compliance with HEP.  Has extreme pain behaviors throughout session and insists on working through the pain when therapist instucts to stop activity.  Pt at one point cried out with shoulder abduction and pain behaviors escallated remainder of session and unable to complete further exercise. Educated patient on staying in painfree ROM and not to push self to 15/10 pain.   Pt was tearful and pain of 10/10.  PT returns for re-eval next session.   Pt will benefit from skilled therapeutic intervention in order to improve on the following deficits Decreased activity tolerance;Decreased endurance;Decreased range of motion;Decreased strength;Impaired flexibility;Pain   PT Frequency 2x / week   PT Duration 6 weeks   PT Treatment/Interventions ADLs/Self Care Home Management;Electrical Stimulation;Ultrasound;Therapeutic exercise;Therapeutic activities;Functional mobility training;Patient/family education;Passive range of motion   PT Next Visit Plan Continue with AROM and progressive strengthening of UE. Re-evaluate  next session.  Problem List There are no active problems to display for this patient.  Lurena Nidamy B Tuvia Woodrick, PTA/CLT [phone] 02/19/2016, 11:15 AM  [facility] [address] [place], [place], [address] Phone: [phone]   Fax:  [phone]  Name: Ricardo Maldonado MRN: [id number] Date of Birth: 12/17/1955

## 2016-02-21 ENCOUNTER — Ambulatory Visit (HOSPITAL_COMMUNITY): Payer: No Typology Code available for payment source | Admitting: Physical Therapy

## 2016-02-21 DIAGNOSIS — M25612 Stiffness of left shoulder, not elsewhere classified: Secondary | ICD-10-CM

## 2016-02-21 DIAGNOSIS — M25512 Pain in left shoulder: Secondary | ICD-10-CM | POA: Diagnosis not present

## 2016-02-21 DIAGNOSIS — R29898 Other symptoms and signs involving the musculoskeletal system: Secondary | ICD-10-CM

## 2016-02-21 DIAGNOSIS — M542 Cervicalgia: Secondary | ICD-10-CM
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
## 2016-02-21 NOTE — Patient Instructions (Signed)
External Rotation (Prone)    Lie with right upper arm straight out from body, elbow bent to 90, _2___ pound weight in hand. Rotate forearm up, keeping elbow bent. Return slowly. Repeat __10__ times per set. Do __1__ sets per session. Do ___2 http://orth.exer.us/940   Copyright  VHI. All rights reserved.  Strengthening: Resisted Abduction    Hold tubing with right arm across body. Pull up and away from side. Move through pain-free range of motion. Repeat _10___ times per set. Do _1___ sets per session. Do __2__ sessions per day.  http://orth.exer.us/826   Copyright  VHI. All rights reserved.  Strengthening: Resisted External Rotation    Hold tubing in right hand, elbow at side and forearm across body. Rotate forearm out. Repeat 10____ times per set. Do __1__ sets per session. Do ___2_ sessions per day.  http://orth.exer.us/828   Copyright  VHI. All rights reserved.  Strengthening: Resisted Internal Rotation    Hold tubing in left hand, elbow at side and forearm out. Rotate forearm in across body. Repeat _10___ times per set. Do __1__ sets per session. Do _2___ sessions per day.  http://orth.exer.us/830   Copyright  VHI. All rights reserved.  Strengthening: Resisted Flexion    Hold tubing with left arm at side. Pull forward and up. Move shoulder through pain-free range of motion. Repeat 10____ times per set. Do _1___ sets per session. Do _2___ sessions per day.  http://orth.exer.us/824   Copyright  VHI. All rights reserved.

## 2016-02-26 ENCOUNTER — Ambulatory Visit (HOSPITAL_COMMUNITY): Payer: No Typology Code available for payment source | Admitting: Physical Therapy

## 2016-02-26 DIAGNOSIS — M542 Cervicalgia: Secondary | ICD-10-CM

## 2016-02-26 DIAGNOSIS — M25512 Pain in left shoulder: Secondary | ICD-10-CM

## 2016-02-26 DIAGNOSIS — R29898 Other symptoms and signs involving the musculoskeletal system: Secondary | ICD-10-CM

## 2016-02-26 DIAGNOSIS — M25612 Stiffness of left shoulder, not elsewhere classified: Secondary | ICD-10-CM
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
## 2016-02-28 ENCOUNTER — Ambulatory Visit (HOSPITAL_COMMUNITY): Payer: No Typology Code available for payment source | Admitting: Physical Therapy

## 2016-02-28 DIAGNOSIS — M542 Cervicalgia: Secondary | ICD-10-CM

## 2016-02-28 DIAGNOSIS — M25512 Pain in left shoulder: Secondary | ICD-10-CM | POA: Diagnosis not present

## 2016-02-28 DIAGNOSIS — M25612 Stiffness of left shoulder, not elsewhere classified: Secondary | ICD-10-CM

## 2016-02-28 DIAGNOSIS — R29898 Other symptoms and signs involving the musculoskeletal system: Secondary | ICD-10-CM

## 2016-02-28 NOTE — Therapy (Signed)
[place] [address] [place], [place], [address] Phone: [phone]   Fax:  [phone]  Physical Therapy Treatment  Patient Details  Name: Ricardo Maldonado MRN: [id number] Date of Birth: 05/21/1956 Referring Provider: Dahlia Byes  Encounter Date: 02/28/2016      PT End of Session - 02/28/16 1152    Visit Number 8   Number of Visits 12   Date for PT Re-Evaluation 03/22/16   Authorization Type VA-G code needed on 10th visit    PT Start Time 1101   PT Stop Time 1147   PT Time Calculation (min) 46 min   Activity Tolerance Patient tolerated treatment well   Behavior During Therapy [facility] for tasks assessed/performed      Past Medical History  Diagnosis Date  . Diabetes mellitus   . Hypertension   . COPD (chronic obstructive pulmonary disease)   . Bronchitis, chronic   . Emphysema   . Emphysema of lung     Past Surgical History  Procedure Laterality Date  . Hernia repair    . Cardiac catheterization    . Colonoscopy      There were no vitals filed for this visit.  Visit Diagnosis:  Pain in joint of left shoulder  Shoulder stiffness, left  Shoulder weakness  Neck pain on left side      Subjective Assessment - 02/28/16 1104    Subjective My shoulder, neck, and back have been hurting worse. Seems to have gotten worse since hurting his shoulder. Saw the doctor and the plan is to wait to decide anything until after PT is done. Might be getting an appointment with an orthopedic surgeon sometime in the future. Back got hurt climbing on a ladder on Sunday too.    Currently in Pain? Yes   Pain Score 8    Pain Location Shoulder   Pain Orientation Left   Pain Descriptors / Indicators Sharp   Pain Type Chronic pain   Pain Onset More than a month ago   Pain Frequency Constant   Aggravating Factors  trying to use the left arm, raising up the Lt arm, reaching overhead.    Pain Relieving Factors 1 hour after therapy.                           [place] Adult PT Treatment/Exercise - 02/28/16 0001    Shoulder Exercises: Prone   Retraction Strengthening;Left;10 reps;Weights   Retraction Weight (lbs) 2   Extension Strengthening;Left;10 reps   External Rotation Strengthening;Left;10 reps   Horizontal ABduction 1 Strengthening;Left;10 reps  pt limiting range due to reports of pain   Shoulder Exercises: Sidelying   External Rotation Strengthening;10 reps  2 sets   External Rotation Weight (lbs) 2   Shoulder Exercises: Standing   External Rotation Strengthening;Left;10 reps;Other (comment)  2X10   Theraband Level (Shoulder External Rotation) Level 3 (Green)   Internal Rotation Strengthening;Left;10 reps;Theraband  2 x10   Theraband Level (Shoulder Internal Rotation) Level 3 (Green)   Flexion Strengthening;Left;10 reps   Theraband Level (Shoulder Flexion) Other (comment)  2 X 10 , no resistance   Other Standing Exercises reaching (1000g ball) shoulder to crown height on shelves 2X10   Shoulder Exercises: Pulleys   Flexion 2 minutes   ABduction 2 minutes   Shoulder Exercises: ROM/Strengthening   UBE (Upper Arm Bike) 6 minutes level 3  PT Education - 02/28/16 1150    Education provided Yes   Education Details reviewing exercise technique, avoiding compensations. Discussing strengthening in available range without increasing pain.    Person(s) Educated Patient   Methods Explanation;Demonstration;Tactile cues;Verbal cues   Comprehension Verbalized understanding;Returned demonstration;Need further instruction          PT Short Term Goals - 02/21/16 1149    PT SHORT TERM GOAL #1   Title Pt pain in his shoulder to be no greater than a 5/10 to be able to lift his arm to shoulder height and perform job duties.    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    Time 3   Period Weeks   Status Achieved   PT SHORT TERM GOAL #3   Title Pt active ROM for abduction to be to 90 degrees to make getting in and out of shirts easier.    Time 3   Period Weeks   Status Achieved   PT SHORT TERM GOAL #4   Title Pt cervical ROM improved by 10 degres to allow safer driving    Time 3   Period Weeks   Status Achieved   PT SHORT TERM GOAL #5   Title Pt to be completing a home exercise program to obtain goal.    Time 3   Period Weeks   Status Achieved           PT Long Term Goals - 02/21/16 1150    PT LONG TERM GOAL #1   Title Pt pain level to be no greater than a 3/10 to allow pt to complete all work duties including without having to rest.    Time 6   Period Weeks   Status Not Met   PT LONG TERM GOAL #2   Title Pt ROM to be functional (standing sholulder flexion and abduction to be at 165) to allow pt to bath under his arm without difficulty, place items such as plates in higher cabinets without difficulty.    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    Baseline Has the strength but still states that this activity is difficult   Time 6   Period Weeks   Status On-going   PT LONG TERM GOAL #4   Title Pt to be able to turn his head to the left with 20 more degrees to improve safety of driving    Time 6   Period Weeks   Status Achieved   PT LONG TERM GOAL #5   Title Pt to be completing an advance home exercise to be able to obtain goals in a more expedient manner.    Time 6   Period Weeks   Status Achieved               Plan - 02/28/16 1153    Clinical  Impression Statement  Session focusing on shoulder complex ROM and strengthening, including rotator cuff and scapular stabilization. Discussing with patient to stay in painfree range with strengthening (patient's demonstration of painfree range was noted to vary during session). By end of session the patient stated that he feel better not that it's all loosened up. Pt denies any qustions or concerns.    PT Next Visit Plan continue with scapular and rotator cuff stabilization exercises.    PT Home Exercise Plan modify as needed    Consulted and Agree with Plan of Care Patient        Problem List There are no active problems to display for this patient.   Cassell Clement, PT, CSCS Pager [phone]  02/28/2016, 11:58 AM  [place], [place], [address] Phone: [phone]   Fax:  [phone]  Name: Ricardo Maldonado MRN: [id number] Date of Birth: Feb 04, 1956

## 2016-03-04 ENCOUNTER — Ambulatory Visit (HOSPITAL_COMMUNITY): Payer: No Typology Code available for payment source | Admitting: Physical Therapy

## 2016-03-04 DIAGNOSIS — M25612 Stiffness of left shoulder, not elsewhere classified: Secondary | ICD-10-CM

## 2016-03-04 DIAGNOSIS — M542 Cervicalgia: Secondary | ICD-10-CM

## 2016-03-04 DIAGNOSIS — R29898 Other symptoms and signs involving the musculoskeletal system: Secondary | ICD-10-CM

## 2016-03-04 DIAGNOSIS — M25512 Pain in left shoulder: Secondary | ICD-10-CM

## 2016-03-04 NOTE — Therapy (Signed)
[place], [place], [address] Phone: [phone]   Fax:  [phone]  Physical Therapy Treatment  Patient Details  Name: Ricardo Maldonado MRN: [id number] Date of Birth: 09-Dec-1956 Referring Provider: Dahlia Byes  Encounter Date: 03/04/2016      PT End of Session - 03/04/16 1043    Visit Number 9   Number of Visits 12   Date for PT Re-Evaluation 03/22/16   Authorization Type VA-G code needed on 10th visit    PT Start Time 1015   PT Stop Time 1055   PT Time Calculation (min) 40 min   Activity Tolerance Patient tolerated treatment well   Behavior During Therapy [facility] for tasks assessed/performed      Past Medical History  Diagnosis Date  . Diabetes mellitus   . Hypertension   . COPD (chronic obstructive pulmonary disease)   . Bronchitis, chronic   . Emphysema   . Emphysema of lung     Past Surgical History  Procedure Laterality Date  . Hernia repair    . Cardiac catheterization    . Colonoscopy      There were no vitals filed for this visit.  Visit Diagnosis:  Pain in joint of left shoulder  Shoulder stiffness, left  Shoulder weakness  Neck pain on left side      Subjective Assessment - 03/04/16 1017    Subjective Pt states that he is doing his exercieses at home    Currently in Pain? Yes   Pain Score 7    Pain Location Shoulder   Pain Orientation Left   Pain Descriptors / Indicators Aching   Pain Type Chronic pain   Pain Radiating Towards deltoid   Pain Onset More than a month ago   Pain Frequency Constant   Aggravating Factors  activity    Pain Relieving Factors ice    Pain Score 9   Pain Location Neck   Pain Orientation Left   Pain Descriptors / Indicators Aching   Pain Onset More than a month ago   Pain Frequency Constant   Aggravating Factors  activity    Pain Relieving Factors portable traction                          [facility] Adult PT Treatment/Exercise  - 03/04/16 0001    Exercises   Exercises Shoulder   Shoulder Exercises: Standing   Horizontal ABduction Left;15 reps;Theraband   Theraband Level (Shoulder Horizontal ABduction) Level 3 (Green)   External Rotation Strengthening;Left;15 reps;Theraband   Theraband Level (Shoulder External Rotation) Level 3 (Green)   Internal Rotation Strengthening;15 reps;Theraband   Theraband Level (Shoulder Internal Rotation) Level 3 (Green)   Flexion Strengthening;Left;15 reps;Theraband   Theraband Level (Shoulder Flexion) Level 3 (Green)   ABduction Strengthening;Left;15 reps;Theraband   Theraband Level (Shoulder ABduction) Level 3 (Green)   Extension Strengthening;Left;15 reps   Theraband Level (Shoulder Extension) Level 3 (Green)   Other Standing Exercises wall washing x 2' ; bicep/ Tricep with elbows to ceiling with 3# x 10 reps    Other Standing Exercises placing objects in second shelf with Left arm ( 1-3#) 3 objects x 4    Shoulder Exercises: ROM/Strengthening   UBE (Upper Arm Bike) 8 minutes level 3    Wall Pushups 5 reps   Plank --  15 seconds x 3 reps    Shoulder Exercises: Stretch   Other Shoulder Stretches posterior capsule stretch; child  pose stretch                   PT Short Term Goals - 02/21/16 1149    PT SHORT TERM GOAL #1   Title Pt pain in his shoulder to be no greater than a 5/10 to be able to lift his arm to shoulder height and perform job duties.    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    Time 3   Period Weeks   Status Achieved   PT SHORT TERM GOAL #3   Title Pt active ROM for abduction to be to 90 degrees to make getting in and out of shirts easier.    Time 3   Period Weeks   Status Achieved   PT SHORT TERM GOAL #4   Title Pt cervical ROM improved by  10 degres to allow safer driving    Time 3   Period Weeks   Status Achieved   PT SHORT TERM GOAL #5   Title Pt to be completing a home exercise program to obtain goal.    Time 3   Period Weeks   Status Achieved           PT Long Term Goals - 02/21/16 1150    PT LONG TERM GOAL #1   Title Pt pain level to be no greater than a 3/10 to allow pt to complete all work duties including without having to rest.    Time 6   Period Weeks   Status Not Met   PT LONG TERM GOAL #2   Title Pt ROM to be functional (standing sholulder flexion and abduction to be at 165) to allow pt to bath under his arm without difficulty, place items such as plates in higher cabinets without difficulty.    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    Baseline Has the strength but still states that this activity is difficult   Time 6   Period Weeks   Status On-going   PT LONG TERM GOAL #4   Title Pt to be able to turn his head to the left with 20 more degrees to improve safety of driving    Time 6   Period Weeks   Status Achieved   PT LONG TERM GOAL #5   Title Pt to be completing an advance home exercise to be able to obtain goals in a more expedient manner.    Time 6   Period Weeks   Status Achieved               Plan - 03/04/16 1044    Clinical Impression Statement Added plank, wall push ups and bicep and tricep work to this session with verbal cuing needed for proper form.  Pt continues to have pain behavior during exercises that are diminished with distraction.    Pt  will benefit from skilled therapeutic intervention in order to improve on the following deficits Decreased activity tolerance;Decreased  endurance;Decreased range of motion;Decreased strength;Impaired flexibility;Pain   Rehab Potential Fair   PT Frequency 2x / week   PT Duration 6 weeks   PT Treatment/Interventions ADLs/Self Care Home Management;Electrical Stimulation;Ultrasound;Therapeutic exercise;Therapeutic activities;Functional mobility training;Patient/family education;Passive range of motion   PT Next Visit Plan continue with scapular and rotator cuff stabilization exercises.         Problem List There are no active problems to display for this patient.   Rayetta Humphrey, PT CLT [phone] 03/04/2016, 10:55 AM  [place], [place], [address] Phone: [phone]   Fax:  [phone]  Name: Ricardo Maldonado MRN: [id number] Date of Birth: 09/10/1956

## 2016-03-06 ENCOUNTER — Ambulatory Visit (HOSPITAL_COMMUNITY): Payer: No Typology Code available for payment source | Admitting: Physical Therapy

## 2016-03-06 DIAGNOSIS — M542 Cervicalgia: Secondary | ICD-10-CM

## 2016-03-06 DIAGNOSIS — R29898 Other symptoms and signs involving the musculoskeletal system: Secondary | ICD-10-CM

## 2016-03-06 DIAGNOSIS — M25612 Stiffness of left shoulder, not elsewhere classified: Secondary | ICD-10-CM

## 2016-03-06 DIAGNOSIS — M25512 Pain in left shoulder: Secondary | ICD-10-CM
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
## 2017-07-07 ENCOUNTER — Other Ambulatory Visit (HOSPITAL_COMMUNITY): Payer: Self-pay | Admitting: Physician Assistant

## 2017-07-07 DIAGNOSIS — M79602 Pain in left arm: Secondary | ICD-10-CM

## 2017-07-07 DIAGNOSIS — M25512 Pain in left shoulder: Secondary | ICD-10-CM

## 2017-07-15 ENCOUNTER — Ambulatory Visit (HOSPITAL_COMMUNITY)
Admission: RE | Admit: 2017-07-15 | Discharge: 2017-07-15 | Disposition: A | Discharge status: Home / Self Care | Payer: Non-veteran care | Source: Ambulatory Visit | Attending: Physician Assistant | Admitting: Physician Assistant

## 2017-07-15 DIAGNOSIS — M4802 Spinal stenosis, cervical region: Secondary | ICD-10-CM | POA: Insufficient documentation

## 2017-07-15 DIAGNOSIS — M19012 Primary osteoarthritis, left shoulder: Secondary | ICD-10-CM | POA: Insufficient documentation

## 2017-07-15 DIAGNOSIS — M47812 Spondylosis without myelopathy or radiculopathy, cervical region: Secondary | ICD-10-CM | POA: Insufficient documentation

## 2017-07-15 DIAGNOSIS — M25512 Pain in left shoulder: Secondary | ICD-10-CM | POA: Insufficient documentation

## 2017-07-15 DIAGNOSIS — M75102 Unspecified rotator cuff tear or rupture of left shoulder, not specified as traumatic: Secondary | ICD-10-CM | POA: Insufficient documentation

## 2017-07-15 DIAGNOSIS — M79602 Pain in left arm: Secondary | ICD-10-CM | POA: Diagnosis present

## 2017-12-08 ENCOUNTER — Emergency Department (HOSPITAL_COMMUNITY)
Admission: EM | Admit: 2017-12-08 | Discharge: 2017-12-08 | Disposition: A | Discharge status: Home / Self Care | Payer: Non-veteran care | Attending: Emergency Medicine | Admitting: Emergency Medicine

## 2017-12-08 ENCOUNTER — Emergency Department (HOSPITAL_COMMUNITY): Payer: Non-veteran care

## 2017-12-08 ENCOUNTER — Encounter (HOSPITAL_COMMUNITY): Payer: Self-pay | Admitting: Emergency Medicine

## 2017-12-08 DIAGNOSIS — Z87891 Personal history of nicotine dependence: Secondary | ICD-10-CM | POA: Insufficient documentation

## 2017-12-08 DIAGNOSIS — Z79899 Other long term (current) drug therapy: Secondary | ICD-10-CM | POA: Insufficient documentation

## 2017-12-08 DIAGNOSIS — E119 Type 2 diabetes mellitus without complications: Secondary | ICD-10-CM | POA: Insufficient documentation

## 2017-12-08 DIAGNOSIS — G8929 Other chronic pain: Secondary | ICD-10-CM | POA: Insufficient documentation

## 2017-12-08 DIAGNOSIS — M5441 Lumbago with sciatica, right side: Secondary | ICD-10-CM

## 2017-12-08 DIAGNOSIS — M545 Low back pain: Secondary | ICD-10-CM | POA: Diagnosis present

## 2017-12-08 DIAGNOSIS — Z7984 Long term (current) use of oral hypoglycemic drugs: Secondary | ICD-10-CM | POA: Diagnosis not present

## 2017-12-08 DIAGNOSIS — J449 Chronic obstructive pulmonary disease, unspecified: Secondary | ICD-10-CM | POA: Insufficient documentation

## 2017-12-08 DIAGNOSIS — J42 Unspecified chronic bronchitis: Secondary | ICD-10-CM | POA: Insufficient documentation

## 2017-12-08 DIAGNOSIS — I10 Essential (primary) hypertension: Secondary | ICD-10-CM | POA: Insufficient documentation

## 2017-12-08 HISTORY — DX: Pain in unspecified shoulder: M25.519

## 2017-12-08 HISTORY — DX: Other chronic pain: G89.29

## 2017-12-08 HISTORY — DX: Cervicalgia: M54.2

## 2017-12-08 MED ORDER — DIAZEPAM 5 MG PO TABS
5.0000 mg | ORAL_TABLET | Freq: Once | ORAL | Status: AC
  Administered 2017-12-08: 5 mg via ORAL
  Filled 2017-12-08: qty 1

## 2017-12-08 MED ORDER — PREDNISONE 10 MG PO TABS: ORAL_TABLET | ORAL | 0 refills | Status: DC

## 2017-12-08 MED ORDER — IPRATROPIUM-ALBUTEROL 0.5-2.5 (3) MG/3ML IN SOLN
3.0000 mL | Freq: Once | RESPIRATORY_TRACT | Status: AC
  Administered 2017-12-08: 3 mL via RESPIRATORY_TRACT
  Filled 2017-12-08: qty 3

## 2017-12-08 MED ORDER — CYCLOBENZAPRINE HCL 10 MG PO TABS: 10.0000 mg | ORAL_TABLET | Freq: Three times a day (TID) | ORAL | 0 refills | Status: AC | PRN

## 2017-12-08 MED ORDER — DEXAMETHASONE SODIUM PHOSPHATE 4 MG/ML IJ SOLN
10.0000 mg | Freq: Once | INTRAMUSCULAR | Status: AC
  Administered 2017-12-08: 10 mg via INTRAMUSCULAR
  Filled 2017-12-08: qty 3

## 2017-12-08 NOTE — ED Triage Notes (Signed)
Pt reports flare of chronic back pain going down right leg x 3 days.  Also c/o congestion and cough x 1 month.

## 2017-12-08 NOTE — ED Provider Notes (Signed)
[facility] EMERGENCY DEPARTMENT Provider Note   CSN: [id number] Arrival date & time: 12/08/17  1138     History   Chief Complaint Chief Complaint  Patient presents with  . Back Pain    HPI Ricardo Maldonado is a 61 y.o. male.  HPI   Ricardo Maldonado is a 61 y.o. male who presents to the Emergency Department complaining of worsening of his chronic low back pain for one week.  He describes a constant, sharp, burning, tingling pain to the right lower back that radiates into his right lateral leg to the level of his calf.  Pain is associated with position change and lying supine.  Improves upon standing.  He also complains of cough and chest congestion for one month.  Has a hx of COPD and states that when he gets a "cold" it makes his COPD worse.  He denies shortness of breath, fever, chills, chest pain. also denies urine or bowel changes, extremity weakness, swelling and abdominal pain.  [facility] [place]       Past Medical History:  Diagnosis Date  . Bronchitis, chronic (HCC)   . Chronic neck pain   . Chronic shoulder pain   . COPD (chronic obstructive pulmonary disease) (HCC)   . Diabetes mellitus   . Emphysema   . Emphysema of lung (HCC)   . Hypertension     There are no active problems to display for this patient.   Past Surgical History:  Procedure Laterality Date  . CARDIAC CATHETERIZATION    . COLONOSCOPY    . HERNIA REPAIR         Home Medications    Prior to Admission medications   Medication Sig Start Date End Date Taking? Authorizing Provider  atenolol (TENORMIN) 25 MG tablet Take 25 mg by mouth daily.      [provider]  B Complex-C (B-COMPLEX WITH VITAMIN C) tablet Take 1 tablet by mouth daily.      [provider]  Cinnamon 500 MG capsule Take 500 mg by mouth 2 (two) times daily.      [provider]  co-enzyme Q-10 30 MG capsule Take 30 mg by mouth 2 (two) times daily.       [provider]  cyclobenzaprine (FLEXERIL) 10 MG tablet Take 10 mg by mouth 3 (three) times daily as needed. Muscle spasms     [provider]  fish oil-omega-3 fatty acids 1000 MG capsule Take 2 g by mouth daily.      [provider]  glipiZIDE (GLUCOTROL) 10 MG tablet Take 10 mg by mouth 2 (two) times daily before a meal.      [provider]  hydrochlorothiazide (HYDRODIURIL) 25 MG tablet Take 25 mg by mouth daily.      [provider]  loratadine (CLARITIN) 10 MG tablet Take 10 mg by mouth daily.      [provider]  metFORMIN (GLUCOPHAGE) 500 MG tablet Take 500 mg by mouth 2 (two) times daily with a meal.      [provider]  promethazine (PHENERGAN) 25 MG suppository Place 1 suppository (25 mg total) rectally every 6 (six) hours as needed for nausea or vomiting. 06/03/14   Ivery QualeBryant, [place], PA-C  promethazine (PHENERGAN) 25 MG tablet Take 25 mg by mouth every 6 (six) hours as needed. Nausea/vomiting     [provider]  ranitidine (ZANTAC) 150 MG tablet Take 150 mg  by mouth 2 (two) times daily.      [provider]  RESVERATROL 100 MG CAPS Take 1 capsule by mouth 2 (two) times daily.      [provider]  simvastatin (ZOCOR) 20 MG tablet Take 10 mg by mouth at bedtime.      [provider]  terazosin (HYTRIN) 5 MG capsule Take 5 mg by mouth at bedtime.      [provider]  vitamin C (ASCORBIC ACID) 500 MG tablet Take 500 mg by mouth daily.      [provider]  vitamin E 400 UNIT capsule Take 400 Units by mouth daily.      [provider]    Family History History reviewed. No pertinent family history.  Social History Social History   Tobacco Use  . Smoking status: Former Smoker  Substance Use Topics  . Alcohol use: No  . Drug use: No     Allergies   Patient has no known allergies.   Review of Systems Review of Systems  Constitutional: Negative for  fever.  HENT: Positive for congestion.   Respiratory: Positive for cough. Negative for chest tightness, shortness of breath, wheezing and stridor.   Cardiovascular: Negative for chest pain and leg swelling.  Gastrointestinal: Negative for abdominal pain, constipation and vomiting.  Genitourinary: Negative for decreased urine volume, difficulty urinating, dysuria, flank pain and hematuria.  Musculoskeletal: Positive for back pain. Negative for joint swelling.  Skin: Negative for rash.  Neurological: Negative for dizziness, weakness and numbness.  All other systems reviewed and are negative.    Physical Exam Updated Vital Signs BP (!) 151/101 (BP Location: Right Arm)   Pulse 98   Temp 98.1 F (36.7 C) (Oral)   Resp 18   Ht 6\' 4"  (1.93 m)   Wt 98.4 kg (217 lb)   SpO2 100%   BMI 26.41 kg/m   Physical Exam  Constitutional: He is oriented to person, place, and time. He appears well-developed and well-nourished. No distress.  HENT:  Head: Normocephalic and atraumatic.  Mouth/Throat: Oropharynx is clear and moist.  Neck: Normal range of motion. Neck supple.  Cardiovascular: Normal rate, regular rhythm and intact distal pulses.  DP pulses are strong and palpable bilaterally  Pulmonary/Chest: Effort normal and breath sounds normal. No stridor. No respiratory distress. He has no wheezes. He exhibits no tenderness.  Coarse lung sounds bilaterally w/o wheezing or rales.  Pt able to speak in complete sentences w/o distress.   Abdominal: Soft. He exhibits no distension. There is no tenderness.  Musculoskeletal: He exhibits tenderness. He exhibits no edema.       Lumbar back: He exhibits tenderness and pain. He exhibits normal range of motion, no swelling, no deformity, no laceration and normal pulse.  ttp of the mid lower spine, right lumbar paraspinal muscles and right SI joint space. Pt has 5/5 strength against resistance of bilateral lower extremities.  No peripheral edema     Neurological: He is alert and oriented to person, place, and time. He has normal strength. No sensory deficit. He exhibits normal muscle tone. Coordination and gait normal.  Reflex Scores:      Patellar reflexes are 2+ on the right side and 2+ on the left side.      Achilles reflexes are 2+ on the right side and 2+ on the left side. Skin: Skin is warm and dry. Capillary refill takes less than 2 seconds. No rash noted.  Psychiatric: He has a normal mood  and affect.  Nursing note and vitals reviewed.    ED Treatments / Results  Labs (all labs ordered are listed, but only abnormal results are displayed) Labs Reviewed - No data to display  EKG  EKG Interpretation None       Radiology No results found.  Procedures Procedures (including critical care time)  Medications Ordered in ED Medications - No data to display   Initial Impression / Assessment and Plan / ED Course  I have reviewed the triage vital signs and the nursing notes.  Pertinent labs & imaging results that were available during my care of the patient were reviewed by me and considered in my medical decision making (see chart for details).     Pt reviewed on the [place] narcotic database, Had rx for #180 Percocet 5/325 mg filled on 11/19/17  Pt feeling better after neb.  No CP, hypoxia or tachycardia,  fever or shortness of breath.  CXR reassuring.  Pt has not been compliant with using his inhaler abd neb tx's at home.  This was discussed and he was encouraged to take his medications as directed. LBP is likely acute on chronic flare.  No focal neuro deficits on exam and pt ambulates in the dept with a steady gait.  No concerning sx's for emergent neurological process.  Appears stable for d/c.  He narcotic pain medication at home as well as upcoming PCP appt.  Return precautions discussed.     Final Clinical Impressions(s) / ED Diagnoses   Final diagnoses:  Chronic right-sided low back pain with right-sided sciatica   Chronic bronchitis, unspecified chronic bronchitis type [facility](HCC)    ED Discharge Orders    None       Pauline Ausriplett, Cephas Revard, PA-C 12/08/17 1647    Samuel JesterMcManus, Kathleen, DO 12/09/17 1939

## 2017-12-08 NOTE — Discharge Instructions (Signed)
Alternate ice and heat to your lower back.  Start the prednisone prescription tomorrow.  Continue taking your percocet as directed.  Try to use your nebulizer treatments daily as directed.  Return here for any worsening symptoms

## 2019-02-15 ENCOUNTER — Other Ambulatory Visit: Payer: Self-pay

## 2019-02-15 ENCOUNTER — Inpatient Hospital Stay (HOSPITAL_COMMUNITY)
Admission: EM | Admit: 2019-02-15 | Discharge: 2019-02-17 | DRG: 193 | Disposition: A | Discharge status: Home / Self Care | Payer: No Typology Code available for payment source | Attending: Family Medicine | Admitting: Family Medicine

## 2019-02-15 ENCOUNTER — Encounter (HOSPITAL_COMMUNITY): Payer: Self-pay | Admitting: Emergency Medicine

## 2019-02-15 ENCOUNTER — Emergency Department (HOSPITAL_COMMUNITY): Payer: No Typology Code available for payment source

## 2019-02-15 DIAGNOSIS — J440 Chronic obstructive pulmonary disease with acute lower respiratory infection: Secondary | ICD-10-CM | POA: Diagnosis present

## 2019-02-15 DIAGNOSIS — E785 Hyperlipidemia, unspecified: Secondary | ICD-10-CM | POA: Diagnosis present

## 2019-02-15 DIAGNOSIS — M25519 Pain in unspecified shoulder: Secondary | ICD-10-CM | POA: Diagnosis present

## 2019-02-15 DIAGNOSIS — I10 Essential (primary) hypertension: Secondary | ICD-10-CM | POA: Diagnosis present

## 2019-02-15 DIAGNOSIS — E871 Hypo-osmolality and hyponatremia: Secondary | ICD-10-CM | POA: Diagnosis present

## 2019-02-15 DIAGNOSIS — J9601 Acute respiratory failure with hypoxia: Secondary | ICD-10-CM | POA: Diagnosis present

## 2019-02-15 DIAGNOSIS — J441 Chronic obstructive pulmonary disease with (acute) exacerbation: Secondary | ICD-10-CM | POA: Diagnosis present

## 2019-02-15 DIAGNOSIS — E860 Dehydration: Secondary | ICD-10-CM | POA: Diagnosis present

## 2019-02-15 DIAGNOSIS — Z87891 Personal history of nicotine dependence: Secondary | ICD-10-CM

## 2019-02-15 DIAGNOSIS — J189 Pneumonia, unspecified organism: Secondary | ICD-10-CM | POA: Diagnosis not present

## 2019-02-15 DIAGNOSIS — Z79899 Other long term (current) drug therapy: Secondary | ICD-10-CM

## 2019-02-15 DIAGNOSIS — R0602 Shortness of breath: Secondary | ICD-10-CM | POA: Diagnosis not present

## 2019-02-15 DIAGNOSIS — E119 Type 2 diabetes mellitus without complications: Secondary | ICD-10-CM | POA: Diagnosis present

## 2019-02-15 DIAGNOSIS — J101 Influenza due to other identified influenza virus with other respiratory manifestations: Secondary | ICD-10-CM | POA: Diagnosis not present

## 2019-02-15 DIAGNOSIS — M542 Cervicalgia: Secondary | ICD-10-CM | POA: Diagnosis present

## 2019-02-15 DIAGNOSIS — J1000 Influenza due to other identified influenza virus with unspecified type of pneumonia: Secondary | ICD-10-CM | POA: Diagnosis not present

## 2019-02-15 DIAGNOSIS — J188 Other pneumonia, unspecified organism: Secondary | ICD-10-CM | POA: Diagnosis present

## 2019-02-15 DIAGNOSIS — Z7951 Long term (current) use of inhaled steroids: Secondary | ICD-10-CM

## 2019-02-15 DIAGNOSIS — G8929 Other chronic pain: Secondary | ICD-10-CM | POA: Diagnosis present

## 2019-02-15 LAB — URINALYSIS, ROUTINE W REFLEX MICROSCOPIC
BACTERIA UA: NONE SEEN
Bilirubin Urine: NEGATIVE
Glucose, UA: >=500 mg/dL — AB
Ketones, ur: 80 mg/dL — AB
Leukocytes,Ua: NEGATIVE
Nitrite: NEGATIVE
Protein, ur: 100 mg/dL — AB
SPECIFIC GRAVITY, URINE: 1.022 (ref 1.005–1.030)
pH: 7 (ref 5.0–8.0)

## 2019-02-15 LAB — COMPREHENSIVE METABOLIC PANEL
ALBUMIN: 4.2 g/dL (ref 3.5–5.0)
ALK PHOS: 44 U/L (ref 38–126)
ALT: 18 U/L (ref 0–44)
ANION GAP: 12 (ref 5–15)
AST: 45 U/L — ABNORMAL HIGH (ref 15–41)
BUN: 13 mg/dL (ref 8–23)
CHLORIDE: 96 mmol/L — AB (ref 98–111)
CO2: 25 mmol/L (ref 22–32)
CREATININE: 1.03 mg/dL (ref 0.61–1.24)
Calcium: 8.5 mg/dL — ABNORMAL LOW (ref 8.9–10.3)
GFR calc non Af Amer: >60 mL/min (ref >60)
GLUCOSE: 154 mg/dL — AB (ref 70–99)
Potassium: 3.5 mmol/L (ref 3.5–5.1)
SODIUM: 133 mmol/L — AB (ref 135–145)
Total Bilirubin: 1.4 mg/dL — ABNORMAL HIGH (ref 0.3–1.2)
Total Protein: 7.8 g/dL (ref 6.5–8.1)

## 2019-02-15 LAB — INFLUENZA PANEL BY PCR (TYPE A & B)
Influenza A By PCR: POSITIVE — AB
Influenza B By PCR: NEGATIVE

## 2019-02-15 LAB — CBC
HEMATOCRIT: 49.8 % (ref 39.0–52.0)
Hemoglobin: 16.6 g/dL (ref 13.0–17.0)
MCH: 29.6 pg (ref 26.0–34.0)
MCHC: 33.3 g/dL (ref 30.0–36.0)
MCV: 88.8 fL (ref 80.0–100.0)
Platelets: 134 10*3/uL — ABNORMAL LOW (ref 150–400)
RBC: 5.61 MIL/uL (ref 4.22–5.81)
RDW: 13.2 % (ref 11.5–15.5)
WBC: 13.9 10*3/uL — ABNORMAL HIGH (ref 4.0–10.5)
nRBC: 0 % (ref 0.0–0.2)

## 2019-02-15 LAB — LIPASE, BLOOD: LIPASE: 27 U/L (ref 11–51)

## 2019-02-15 LAB — CBG MONITORING, ED
GLUCOSE-CAPILLARY: 129 mg/dL — AB (ref 70–99)
Glucose-Capillary: 164 mg/dL — ABNORMAL HIGH (ref 70–99)

## 2019-02-15 MED ORDER — SODIUM CHLORIDE 0.9 % IV SOLN
500.0000 mg | INTRAVENOUS | Status: DC
  Administered 2019-02-15 – 2019-02-17 (×3): 500 mg via INTRAVENOUS
  Filled 2019-02-15 (×3): qty 500

## 2019-02-15 MED ORDER — METHYLPREDNISOLONE SODIUM SUCC 40 MG IJ SOLR
40.0000 mg | Freq: Two times a day (BID) | INTRAMUSCULAR | Status: DC
  Administered 2019-02-15 – 2019-02-17 (×4): 40 mg via INTRAVENOUS
  Filled 2019-02-15 (×4): qty 1

## 2019-02-15 MED ORDER — ONDANSETRON HCL 4 MG/2ML IJ SOLN: 4.0000 mg | Freq: Four times a day (QID) | INTRAMUSCULAR | Status: DC | PRN

## 2019-02-15 MED ORDER — CINNAMON 500 MG PO CAPS: 500.0000 mg | ORAL_CAPSULE | Freq: Two times a day (BID) | ORAL | Status: DC

## 2019-02-15 MED ORDER — MOMETASONE FURO-FORMOTEROL FUM 200-5 MCG/ACT IN AERO
2.0000 | INHALATION_SPRAY | Freq: Two times a day (BID) | RESPIRATORY_TRACT | Status: DC
  Administered 2019-02-16 – 2019-02-17 (×2): 2 via RESPIRATORY_TRACT
  Filled 2019-02-15: qty 8.8

## 2019-02-15 MED ORDER — IPRATROPIUM-ALBUTEROL 0.5-2.5 (3) MG/3ML IN SOLN
3.0000 mL | Freq: Four times a day (QID) | RESPIRATORY_TRACT | Status: DC
  Administered 2019-02-15 – 2019-02-17 (×7): 3 mL via RESPIRATORY_TRACT
  Filled 2019-02-15 (×7): qty 3

## 2019-02-15 MED ORDER — ACETAMINOPHEN 650 MG RE SUPP: 650.0000 mg | Freq: Four times a day (QID) | RECTAL | Status: DC | PRN

## 2019-02-15 MED ORDER — SODIUM CHLORIDE 0.9 % IV SOLN
1.0000 g | INTRAVENOUS | Status: DC
  Administered 2019-02-16 – 2019-02-17 (×2): 1 g via INTRAVENOUS
  Filled 2019-02-15 (×2): qty 10

## 2019-02-15 MED ORDER — SODIUM CHLORIDE 0.9 % IV SOLN
INTRAVENOUS | Status: AC
  Administered 2019-02-15: 19:00:00 via INTRAVENOUS

## 2019-02-15 MED ORDER — CYCLOBENZAPRINE HCL 10 MG PO TABS
10.0000 mg | ORAL_TABLET | Freq: Three times a day (TID) | ORAL | Status: DC | PRN
Start: 2019-02-15 — End: 2019-02-17

## 2019-02-15 MED ORDER — BUDESONIDE 0.25 MG/2ML IN SUSP
0.2500 mg | Freq: Two times a day (BID) | RESPIRATORY_TRACT | Status: DC
  Administered 2019-02-15 – 2019-02-17 (×4): 0.25 mg via RESPIRATORY_TRACT
  Filled 2019-02-15 (×4): qty 2

## 2019-02-15 MED ORDER — ONDANSETRON HCL 4 MG PO TABS
4.0000 mg | ORAL_TABLET | Freq: Four times a day (QID) | ORAL | Status: DC | PRN
  Administered 2019-02-16 – 2019-02-17 (×3): 4 mg via ORAL
  Filled 2019-02-15 (×3): qty 1

## 2019-02-15 MED ORDER — SODIUM CHLORIDE 0.9 % IV SOLN
1.0000 g | Freq: Once | INTRAVENOUS | Status: AC
  Administered 2019-02-15: 1 g via INTRAVENOUS
  Filled 2019-02-15: qty 10

## 2019-02-15 MED ORDER — ENOXAPARIN SODIUM 40 MG/0.4ML ~~LOC~~ SOLN
40.0000 mg | SUBCUTANEOUS | Status: DC
  Administered 2019-02-15 – 2019-02-16 (×2): 40 mg via SUBCUTANEOUS
  Filled 2019-02-15 (×2): qty 0.4

## 2019-02-15 MED ORDER — ATENOLOL 25 MG PO TABS
25.0000 mg | ORAL_TABLET | Freq: Every day | ORAL | Status: DC
  Administered 2019-02-15 – 2019-02-17 (×3): 25 mg via ORAL
  Filled 2019-02-15 (×3): qty 1

## 2019-02-15 MED ORDER — IPRATROPIUM-ALBUTEROL 0.5-2.5 (3) MG/3ML IN SOLN
3.0000 mL | Freq: Once | RESPIRATORY_TRACT | Status: AC
  Administered 2019-02-15: 3 mL via RESPIRATORY_TRACT
  Filled 2019-02-15: qty 3

## 2019-02-15 MED ORDER — INSULIN ASPART 100 UNIT/ML ~~LOC~~ SOLN
0.0000 [IU] | Freq: Three times a day (TID) | SUBCUTANEOUS | Status: DC
  Administered 2019-02-16 (×2): 3 [IU] via SUBCUTANEOUS
  Administered 2019-02-16: 2 [IU] via SUBCUTANEOUS
  Administered 2019-02-17: 3 [IU] via SUBCUTANEOUS

## 2019-02-15 MED ORDER — ALBUTEROL SULFATE (2.5 MG/3ML) 0.083% IN NEBU
5.0000 mg | INHALATION_SOLUTION | Freq: Once | RESPIRATORY_TRACT | Status: AC
  Administered 2019-02-15: 5 mg via RESPIRATORY_TRACT
  Filled 2019-02-15: qty 6

## 2019-02-15 MED ORDER — INSULIN ASPART 100 UNIT/ML ~~LOC~~ SOLN
0.0000 [IU] | Freq: Every day | SUBCUTANEOUS | Status: DC
  Administered 2019-02-16: 2 [IU] via SUBCUTANEOUS

## 2019-02-15 MED ORDER — ACETAMINOPHEN 325 MG PO TABS: 650.0000 mg | ORAL_TABLET | Freq: Four times a day (QID) | ORAL | Status: DC | PRN

## 2019-02-15 MED ORDER — OXYCODONE-ACETAMINOPHEN 5-325 MG PO TABS
1.0000 | ORAL_TABLET | Freq: Four times a day (QID) | ORAL | Status: DC | PRN
Start: 2019-02-15 — End: 2019-02-17

## 2019-02-15 MED ORDER — LORATADINE 10 MG PO TABS
10.0000 mg | ORAL_TABLET | Freq: Every day | ORAL | Status: DC
  Administered 2019-02-15 – 2019-02-17 (×3): 10 mg via ORAL
  Filled 2019-02-15 (×3): qty 1

## 2019-02-15 NOTE — ED Provider Notes (Addendum)
[facility] EMERGENCY DEPARTMENT Provider Note   CSN: [id number] Arrival date & time: 02/15/19  0820    History   Chief Complaint Chief Complaint  Patient presents with  . Influenza    HPI Ricardo Maldonado is a 63 y.o. male.     The history is provided by the patient.  Influenza  Presenting symptoms: cough, fever and myalgias   Severity:  Severe Onset quality:  Gradual Duration:  3 days Progression:  Worsening Chronicity:  New Relieved by:  Nothing Worsened by:  Nothing Ineffective treatments:  None tried Risk factors: being elderly    Pt worried that Ricardo Maldonado has the flu.  Pt complains of coughing.  Pt coughs until Ricardo Maldonado vomits.  Pt has a history of COPD.  Past Medical History:  Diagnosis Date  . Bronchitis, chronic (HCC)   . Chronic neck pain   . Chronic shoulder pain   . COPD (chronic obstructive pulmonary disease) (HCC)   . Diabetes mellitus   . Emphysema   . Emphysema of lung (HCC)   . Hypertension     There are no active problems to display for this patient.   Past Surgical History:  Procedure Laterality Date  . CARDIAC CATHETERIZATION    . COLONOSCOPY    . HERNIA REPAIR          Home Medications    Prior to Admission medications   Medication Sig Start Date End Date Taking? Authorizing Provider  atenolol (TENORMIN) 25 MG tablet Take 25 mg by mouth daily.      [provider]  B Complex-C (B-COMPLEX WITH VITAMIN C) tablet Take 1 tablet by mouth daily.      [provider]  Cinnamon 500 MG capsule Take 500 mg by mouth 2 (two) times daily.      [provider]  co-enzyme Q-10 30 MG capsule Take 30 mg by mouth 2 (two) times daily.      [provider]  cyclobenzaprine (FLEXERIL) 10 MG tablet Take 1 tablet (10 mg total) by mouth 3 (three) times daily as needed. 12/08/17   Triplett, Tammy, PA-C  fish oil-omega-3 fatty acids 1000 MG capsule Take 2 g by mouth daily.      [provider]  glipiZIDE (GLUCOTROL) 10 MG  tablet Take 10 mg by mouth 2 (two) times daily before a meal.      [provider]  hydrochlorothiazide (HYDRODIURIL) 25 MG tablet Take 25 mg by mouth daily.      [provider]  loratadine (CLARITIN) 10 MG tablet Take 10 mg by mouth daily.      [provider]  metFORMIN (GLUCOPHAGE) 500 MG tablet Take 500 mg by mouth 2 (two) times daily with a meal.      [provider]  predniSONE (DELTASONE) 10 MG tablet Take 6 tablets day one, 5 tablets day two, 4 tablets day three, 3 tablets day four, 2 tablets day five, then 1 tablet day six 12/08/17   Triplett, Tammy, PA-C  promethazine (PHENERGAN) 25 MG suppository Place 1 suppository (25 mg total) rectally every 6 (six) hours as needed for nausea or vomiting. 06/03/14   Ivery Quale, PA-C  promethazine (PHENERGAN) 25 MG tablet Take 25 mg by mouth every 6 (six) hours as needed. Nausea/vomiting     [provider]  ranitidine (ZANTAC) 150 MG tablet Take 150 mg by mouth 2 (two) times daily.      [provider]  RESVERATROL 100 MG CAPS Take 1  capsule by mouth 2 (two) times daily.      [provider]  simvastatin (ZOCOR) 20 MG tablet Take 10 mg by mouth at bedtime.      [provider]  terazosin (HYTRIN) 5 MG capsule Take 5 mg by mouth at bedtime.      [provider]  vitamin C (ASCORBIC ACID) 500 MG tablet Take 500 mg by mouth daily.      [provider]  vitamin E 400 UNIT capsule Take 400 Units by mouth daily.      [provider]    Family History History reviewed. No pertinent family history.  Social History Social History   Tobacco Use  . Smoking status: Former Games developer  . Smokeless tobacco: Never Used  Substance Use Topics  . Alcohol use: No  . Drug use: No     Allergies   Patient has no known allergies.   Review of Systems Review of Systems  Constitutional: Positive for fever.  Respiratory: Positive for cough.   Musculoskeletal:  Positive for myalgias.     Physical Exam Updated Vital Signs BP 121/67 (BP Location: Right Arm)   Pulse (!) 106   Temp 99.1 F (37.3 C) (Oral)   Resp 18   Ht 6\' 4"  (1.93 m)   Wt 99.8 kg   SpO2 95%   BMI 26.78 kg/m   Physical Exam Vitals signs reviewed.  HENT:     Head: Normocephalic.     Nose: Nose normal.     Mouth/Throat:     Mouth: Mucous membranes are moist.  Eyes:     Pupils: Pupils are equal, round, and reactive to light.  Neck:     Musculoskeletal: Normal range of motion.  Cardiovascular:     Rate and Rhythm: Tachycardia present.  Pulmonary:     Breath sounds: Wheezing and rhonchi present.  Musculoskeletal: Normal range of motion.  Skin:    General: Skin is warm.  Neurological:     General: No focal deficit present.     Mental Status: Ricardo Maldonado is alert.  Psychiatric:        Mood and Affect: Mood normal.      ED Treatments / Results  Labs (all labs ordered are listed, but only abnormal results are displayed) Labs Reviewed  COMPREHENSIVE METABOLIC PANEL - Abnormal; Notable for the following components:      Result Value   Sodium 133 (*)    Chloride 96 (*)    Glucose, Bld 154 (*)    Calcium 8.5 (*)    AST 45 (*)    Total Bilirubin 1.4 (*)    All other components within normal limits  CBC - Abnormal; Notable for the following components:   WBC 13.9 (*)    Platelets 134 (*)    All other components within normal limits  URINALYSIS, ROUTINE W REFLEX MICROSCOPIC - Abnormal; Notable for the following components:   Glucose, UA >=500 (*)    Hgb urine dipstick MODERATE (*)    Ketones, ur 80 (*)    Protein, ur 100 (*)    All other components within normal limits  LIPASE, BLOOD    EKG None  Radiology Dg Chest 2 View  Result Date: 02/15/2019 CLINICAL DATA:  Flu-like symptoms for 2 days, initial encounter EXAM: CHEST - 2 VIEW COMPARISON:  12/08/2017 FINDINGS: Cardiac shadows within normal limits. The lungs are well aerated bilaterally. Increased lingular  density is noted consistent with acute infiltrate. No sizable effusion is seen. No  acute bony abnormality is noted. IMPRESSION: Lingular infiltrate. Followup PA and lateral chest X-ray is recommended in 3-4 weeks following trial of antibiotic therapy to ensure resolution and exclude underlying malignancy. Electronically Signed   By: Alcide Clever M.D.   On: 02/15/2019 09:30    Procedures Procedures (including critical care time)  Medications Ordered in ED Medications  azithromycin (ZITHROMAX) 500 mg in sodium chloride 0.9 % 250 mL IVPB (500 mg Intravenous New Bag/Given 02/15/19 1212)  ipratropium-albuterol (DUONEB) 0.5-2.5 (3) MG/3ML nebulizer solution 3 mL (3 mLs Nebulization Given 02/15/19 0954)  cefTRIAXone (ROCEPHIN) 1 g in sodium chloride 0.9 % 100 mL IVPB (0 g Intravenous Stopped 02/15/19 1200)  albuterol (PROVENTIL) (2.5 MG/3ML) 0.083% nebulizer solution 5 mg (5 mg Nebulization Given 02/15/19 1320)     Initial Impression / Assessment and Plan / ED Course  I have reviewed the triage vital signs and the nursing notes.  Pertinent labs & imaging results that were available during my care of the patient were reviewed by me and considered in my medical decision making (see chart for details).    Pt had 02 sats 86%.  rn placed pt on 02.  Pt is not on oxygen at home.   Pt given duoneb,  Pt reports some relief. Chest xray shows infiltrates.  Radiologist advised follow up chest xray for clearing  Pt given Rocephin 1 gram IV, Zithromax 500mg  IV.  Pt given 2nd neb treatment.  02 sats 90 on 2 liters.    Hospitalist consulted to admit.   Final Clinical Impressions(s) / ED Diagnoses   Final diagnoses:  Community acquired pneumonia, unspecified laterality    ED Discharge Orders    None        Elson Areas, [place] 02/15/19 1346    [address], [place] 02/15/19 1402    Samuel Jester, DO 02/19/19 1845

## 2019-02-15 NOTE — ED Notes (Signed)
Pt made aware a urine sample is needed at this time and given a urinal.

## 2019-02-15 NOTE — ED Notes (Signed)
Pt denies O2 use at home.

## 2019-02-15 NOTE — ED Triage Notes (Signed)
Pt comes in for flu like sx since Saturday. OTC medications yesterday with no improvement. States N/V/D/, 2 episodes of V/ and 1 of D/ today.

## 2019-02-15 NOTE — H&P (Signed)
History and Physical    Ricardo Maldonado ZOX:[id number] DOB: Sep 10, 1956 DOA: 02/15/2019  [facility]: [facility]   Patient coming from: Home  Chief Complaint: Worsening dyspnea and cough  HPI: Ricardo Maldonado is a 63 y.o. male with medical history significant for hypertension, diabetes, dyslipidemia, and COPD who presented to the ED with worsening shortness of breath as well as cough productive of yellowish sputum that began about 3 days ago.  He is worried about having the flu and has had some mild fevers, chills, myalgias as well.  He denies any history of smoking and has not had any sick contacts.  He has had some nausea on account of fits of coughing, but no abdominal pain or diarrhea noted.  He does not have any inhalers at home that he was using for symptoms.   ED Course: Vital signs demonstrated elevated pulse of 106 and temperature of 99.1.  He was noted to have a saturation of 86% on room air and therefore has been placed on 3 L nasal cannula with saturation in the low 90th percentile.  Two-view chest x-ray demonstrates some lingular infiltrate and white blood cell count is 13.9 and he is noted to have sodium of 133.  He has been given a breathing treatment and has been started on azithromycin and Rocephin with some improvement noted.  Influenza swab has not been collected, unfortunately.  Review of Systems: All others reviewed and otherwise negative.  Past Medical History:  Diagnosis Date  . Bronchitis, chronic (HCC)   . Chronic neck pain   . Chronic shoulder pain   . COPD (chronic obstructive pulmonary disease) (HCC)   . Diabetes mellitus   . Emphysema   . Emphysema of lung (HCC)   . Hypertension     Past Surgical History:  Procedure Laterality Date  . CARDIAC CATHETERIZATION    . COLONOSCOPY    . HERNIA REPAIR       reports that he has quit smoking. He has never used smokeless tobacco. He reports that he does not drink alcohol or use drugs.  No Known  Allergies  History reviewed. No pertinent family history.  Prior to Admission medications   Medication Sig Start Date End Date Taking? Authorizing Provider  albuterol (PROVENTIL HFA;VENTOLIN HFA) 108 (90 Base) MCG/ACT inhaler Inhale 1-2 puffs into the lungs every 6 (six) hours as needed for wheezing or shortness of breath.   Yes [provider]  atenolol (TENORMIN) 25 MG tablet Take 25 mg by mouth daily.     Yes [provider]  budesonide-formoterol (SYMBICORT) 160-4.5 MCG/ACT inhaler Inhale 2 puffs into the lungs 2 (two) times daily.   Yes [provider]  Cinnamon 500 MG capsule Take 500 mg by mouth 2 (two) times daily.     Yes [provider]  cyclobenzaprine (FLEXERIL) 10 MG tablet Take 1 tablet (10 mg total) by mouth 3 (three) times daily as needed. 12/08/17  Yes Triplett, Tammy, PA-C  loratadine (CLARITIN) 10 MG tablet Take 10 mg by mouth daily.     Yes [provider]  oxyCODONE-acetaminophen (PERCOCET/ROXICET) 5-325 MG tablet Take 1 tablet by mouth every 6 (six) hours as needed for severe pain.   Yes [provider]  promethazine (PHENERGAN) 25 MG tablet Take 25 mg by mouth every 6 (six) hours as needed. Nausea/vomiting    Yes [provider]    Physical Exam: Vitals:   02/15/19 1130 02/15/19 1200 02/15/19 1300 02/15/19 1323  BP: 130/75  140/85 121/67 121/67  Pulse: 92 94 88 (!) 106  Resp:    18  Temp:      TempSrc:      SpO2: 95% 90% 92% 95%  Weight:      Height:        Constitutional: NAD, calm, comfortable Vitals:   02/15/19 1130 02/15/19 1200 02/15/19 1300 02/15/19 1323  BP: 130/75 140/85 121/67 121/67  Pulse: 92 94 88 (!) 106  Resp:    18  Temp:      TempSrc:      SpO2: 95% 90% 92% 95%  Weight:      Height:       Eyes: lids and conjunctivae normal ENMT: Mucous membranes are moist.  Neck: normal, supple Respiratory: clear to auscultation bilaterally. Normal respiratory effort. No accessory muscle  use.  Currently on 3 L nasal cannula with no wheezing appreciated. Cardiovascular: Regular rate and rhythm, no murmurs. No extremity edema. Abdomen: no tenderness, no distention. Bowel sounds positive.  Musculoskeletal:  No joint deformity upper and lower extremities.   Skin: no rashes, lesions, ulcers.  Psychiatric: Cannot assess.  Labs on Admission: I have personally reviewed following labs and imaging studies  CBC: Recent Labs  Lab 02/15/19 0909  WBC 13.9*  HGB 16.6  HCT 49.8  MCV 88.8  PLT 134*   Basic Metabolic Panel: Recent Labs  Lab 02/15/19 0909  NA 133*  K 3.5  CL 96*  CO2 25  GLUCOSE 154*  BUN 13  CREATININE 1.03  CALCIUM 8.5*   GFR: Estimated Creatinine Clearance: 89 mL/min (by C-G formula based on SCr of 1.03 mg/dL). Liver Function Tests: Recent Labs  Lab 02/15/19 0909  AST 45*  ALT 18  ALKPHOS 44  BILITOT 1.4*  PROT 7.8  ALBUMIN 4.2   Recent Labs  Lab 02/15/19 0909  LIPASE 27   No results for input(s): AMMONIA in the last 168 hours. Coagulation Profile: No results for input(s): INR, PROTIME in the last 168 hours. Cardiac Enzymes: No results for input(s): CKTOTAL, CKMB, CKMBINDEX, TROPONINI in the last 168 hours. BNP (last 3 results) No results for input(s): PROBNP in the last 8760 hours. HbA1C: No results for input(s): HGBA1C in the last 72 hours. CBG: No results for input(s): GLUCAP in the last 168 hours. Lipid Profile: No results for input(s): CHOL, HDL, LDLCALC, TRIG, CHOLHDL, LDLDIRECT in the last 72 hours. Thyroid Function Tests: No results for input(s): TSH, T4TOTAL, FREET4, T3FREE, THYROIDAB in the last 72 hours. Anemia Panel: No results for input(s): VITAMINB12, FOLATE, FERRITIN, TIBC, IRON, RETICCTPCT in the last 72 hours. Urine analysis:    Component Value Date/Time   COLORURINE YELLOW 02/15/2019 0846   APPEARANCEUR CLEAR 02/15/2019 0846   LABSPEC 1.022 02/15/2019 0846   PHURINE 7.0 02/15/2019 0846   GLUCOSEU >=500 (A)  02/15/2019 0846   HGBUR MODERATE (A) 02/15/2019 0846   BILIRUBINUR NEGATIVE 02/15/2019 0846   KETONESUR 80 (A) 02/15/2019 0846   PROTEINUR 100 (A) 02/15/2019 0846   NITRITE NEGATIVE 02/15/2019 0846   LEUKOCYTESUR NEGATIVE 02/15/2019 0846    Radiological Exams on Admission: Dg Chest 2 View  Result Date: 02/15/2019 CLINICAL DATA:  Flu-like symptoms for 2 days, initial encounter EXAM: CHEST - 2 VIEW COMPARISON:  12/08/2017 FINDINGS: Cardiac shadows within normal limits. The lungs are well aerated bilaterally. Increased lingular density is noted consistent with acute infiltrate. No sizable effusion is seen. No acute bony abnormality is noted. IMPRESSION: Lingular infiltrate. Followup PA and lateral chest X-ray is recommended  in 3-4 weeks following trial of antibiotic therapy to ensure resolution and exclude underlying malignancy. Electronically Signed   By: Alcide Clever M.D.   On: 02/15/2019 09:30    Assessment/Plan Active Problems:   Acute hypoxemic respiratory failure (HCC)   COPD with acute exacerbation (HCC)   Community acquired pneumonia   Hyponatremia    1. Acute hypoxemic respiratory failure secondary to COPD exacerbation with community-acquired pneumonia.  We will plan to continue treatment with Rocephin and azithromycin and check urine strep pneumonia as well as Legionella.  Will obtain influenza swab.  Maintain on IV methylprednisolone as well as Pulmicort and DuoNebs every 6 hours.  Wean oxygen as tolerated to room air. 2. Mild hyponatremia.  Will replete with IV normal saline as he does appear to be somewhat dehydrated and recheck labs in a.m.  Time-limited, gentle fluid. 3. Type 2 diabetes.  Maintain on SSI with carb modified diet. 4. Hypertension.  Maintain on home blood pressure agents. 5. Dyslipidemia.  Maintain on statin.   DVT prophylaxis: Lovenox Code Status: Full Family Communication: None at bedside Disposition Plan: Admit for pneumonia treatment as well as COPD  treatment Consults called: None Admission status: Inpatient, MedSurg   Lisamarie Coke Hoover Brunette DO [facility] Pager [phone]  If 7PM-7AM, please contact night-coverage [email or web address] Password [facility]  02/15/2019, 2:49 PM

## 2019-02-16 DIAGNOSIS — J101 Influenza due to other identified influenza virus with other respiratory manifestations: Secondary | ICD-10-CM | POA: Diagnosis present

## 2019-02-16 LAB — CBC
HCT: 46.2 % (ref 39.0–52.0)
Hemoglobin: 15.5 g/dL (ref 13.0–17.0)
MCH: 30.3 pg (ref 26.0–34.0)
MCHC: 33.5 g/dL (ref 30.0–36.0)
MCV: 90.2 fL (ref 80.0–100.0)
Platelets: 113 10*3/uL — ABNORMAL LOW (ref 150–400)
RBC: 5.12 MIL/uL (ref 4.22–5.81)
RDW: 13 % (ref 11.5–15.5)
WBC: 10.5 10*3/uL (ref 4.0–10.5)
nRBC: 0 % (ref 0.0–0.2)

## 2019-02-16 LAB — GLUCOSE, CAPILLARY
Glucose-Capillary: 190 mg/dL — ABNORMAL HIGH (ref 70–99)
Glucose-Capillary: 247 mg/dL — ABNORMAL HIGH (ref 70–99)
Glucose-Capillary: 231 mg/dL — ABNORMAL HIGH (ref 70–99)
Glucose-Capillary: 220 mg/dL — ABNORMAL HIGH (ref 70–99)

## 2019-02-16 LAB — HIV ANTIBODY (ROUTINE TESTING W REFLEX): HIV Screen 4th Generation wRfx: NONREACTIVE

## 2019-02-16 LAB — BASIC METABOLIC PANEL
Anion gap: 10 (ref 5–15)
BUN: 16 mg/dL (ref 8–23)
CO2: 25 mmol/L (ref 22–32)
Calcium: 8.3 mg/dL — ABNORMAL LOW (ref 8.9–10.3)
Chloride: 99 mmol/L (ref 98–111)
Creatinine, Ser: 1.07 mg/dL (ref 0.61–1.24)
GFR calc Af Amer: >60 mL/min (ref >60)
GFR calc non Af Amer: >60 mL/min (ref >60)
Glucose, Bld: 219 mg/dL — ABNORMAL HIGH (ref 70–99)
Potassium: 3.3 mmol/L — ABNORMAL LOW (ref 3.5–5.1)
Sodium: 134 mmol/L — ABNORMAL LOW (ref 135–145)

## 2019-02-16 LAB — MRSA PCR SCREENING: MRSA by PCR: NEGATIVE

## 2019-02-16 LAB — STREP PNEUMONIAE URINARY ANTIGEN: Strep Pneumo Urinary Antigen: POSITIVE — AB

## 2019-02-16 MED ORDER — OSELTAMIVIR PHOSPHATE 75 MG PO CAPS
75.0000 mg | ORAL_CAPSULE | Freq: Two times a day (BID) | ORAL | Status: DC
  Administered 2019-02-16 – 2019-02-17 (×3): 75 mg via ORAL
  Filled 2019-02-16 (×3): qty 1

## 2019-02-16 NOTE — Progress Notes (Signed)
Patient Demographics:    Ricardo Maldonado, is a 63 y.o. male, DOB - [birth year], YVO:[id number]  Admit date - 02/15/2019   Admitting Physician Pratik Hoover Brunette, DO  Outpatient Primary [facility] for the patient is [facility]  LOS - 1   Chief Complaint  Patient presents with  . Influenza        Subjective:    Ricardo Maldonado today has no emesis,  No chest pain, cough and congestion improving,  Assessment  & Plan :    Principal Problem:   Influenza A Active Problems:   Acute hypoxemic respiratory failure (HCC)   COPD with acute exacerbation (HCC)   Community acquired pneumonia   Hyponatremia  Brief Summary 63 y.o. male with medical history significant for hypertension, diabetes, dyslipidemia, and COPD admitted on 02/15/2019 with influenza A and acute hypoxic respiratory failure   Plan:- 1)Influenza A----cough and congestion noted, Tamiflu as ordered, symptomatic and supportive treatment , try to wean off oxygen  2)Acute hypoxic respiratory failure--- #1 above, try to wean off oxygen, currently on Rocephin and azithromycin for possible concomitant community-acquired pneumonia.... O2 sats in the 80s  3) acute COPD exacerbation--2/2 to #1 above, continue IV Solu-Medrol,, bronchodilators and mucolytics as ordered  4)HTN--stable, continue atenolol  5)DM2--- Use Novolog/Humalog Sliding scale insulin with Accu-Cheks/Fingersticks as ordered, anticipate worsening hyperglycemia due to steroids    Disposition/Need for in-Hospital Stay- patient unable to be discharged at this time due to acute hypoxic respiratory failure and concerns about pneumonia currently requiring IV antibiotics, trying to wean off oxygen--O2 sats in the 80s  Code Status : full  Family Communication:   na   Disposition Plan  : home  Consults  :  na  DVT Prophylaxis  :  Lovenox -  Lab Results  Component Value Date   PLT 113  (L) 02/16/2019    Inpatient Medications  Scheduled Meds: . atenolol  25 mg Oral Daily  . budesonide (PULMICORT) nebulizer solution  0.25 mg Nebulization BID  . enoxaparin (LOVENOX) injection  40 mg Subcutaneous Q24H  . insulin aspart  0-5 Units Subcutaneous QHS  . insulin aspart  0-9 Units Subcutaneous TID WC  . ipratropium-albuterol  3 mL Nebulization Q6H  . loratadine  10 mg Oral Daily  . methylPREDNISolone (SOLU-MEDROL) injection  40 mg Intravenous Q12H  . mometasone-formoterol  2 puff Inhalation BID  . oseltamivir  75 mg Oral BID   Continuous Infusions: . azithromycin 500 mg (02/16/19 1109)  . cefTRIAXone (ROCEPHIN)  IV 1 g (02/16/19 1000)   PRN Meds:.acetaminophen **OR** acetaminophen, cyclobenzaprine, ondansetron **OR** ondansetron (ZOFRAN) IV, oxyCODONE-acetaminophen    Anti-infectives (From admission, onward)   Start     Dose/Rate Route Frequency Ordered Stop   02/16/19 1000  cefTRIAXone (ROCEPHIN) 1 g in sodium chloride 0.9 % 100 mL IVPB     1 g 200 mL/hr over 30 Minutes Intravenous Every 24 hours 02/15/19 1503     02/16/19 1000  oseltamivir (TAMIFLU) capsule 75 mg     75 mg Oral 2 times daily 02/16/19 0756 02/21/19 0959   02/15/19 1100  cefTRIAXone (ROCEPHIN) 1 g in sodium chloride 0.9 % 100 mL IVPB     1 g 200 mL/hr over 30 Minutes Intravenous  Once 02/15/19  1054 02/15/19 1200   02/15/19 1100  azithromycin (ZITHROMAX) 500 mg in sodium chloride 0.9 % 250 mL IVPB     500 mg 250 mL/hr over 60 Minutes Intravenous Every 24 hours 02/15/19 1054          Objective:   Vitals:   02/16/19 0933 02/16/19 1402 02/16/19 1409 02/16/19 1500  BP:  138/88  140/85  Pulse:  68  77  Resp:  20    Temp:  97.7 F (36.5 C)    TempSrc:  Oral    SpO2: 96% 94% 92% (!) 89%  Weight:      Height:        Wt Readings from Last 3 Encounters:  02/16/19 92.5 kg  12/08/17 98.4 kg  06/02/14 111.1 kg     Intake/Output Summary (Last 24 hours) at 02/16/2019 1813 Last data filed at  02/16/2019 1508 Gross per 24 hour  Intake 600 ml  Output -  Net 600 ml     Physical Exam Patient is examined daily including today on 02/16/19 , exams remain the same as of yesterday except that has changed   Gen:- Awake Alert,  In no apparent distress  HEENT:- [place].AT, No sclera icterus Nose- 2L/min Neck-Supple Neck,No JVD,.  Lungs-diminished bases with scattered rhonchi, very few wheezes in upper lung fields CV- S1, S2 normal, regular  Abd-  +ve B.Sounds, Abd Soft, No tenderness,    Extremity/Skin:- No  edema, pedal pulses present  Psych-affect is appropriate, oriented x3 Neuro-no new focal deficits, no tremors   Data Review:   Micro Results Recent Results (from the past 240 hour(s))  MRSA PCR Screening     Status: None   Collection Time: 02/16/19  5:06 AM  Result Value Ref Range Status   MRSA by PCR NEGATIVE NEGATIVE Final    Comment:        The GeneXpert MRSA Assay (FDA approved for NASAL specimens only), is one component of a comprehensive MRSA colonization surveillance program. It is not intended to diagnose MRSA infection nor to guide or monitor treatment for MRSA infections. Performed at [facility], [address] [place], [place] [address]     Radiology Reports Dg Chest 2 View  Result Date: 02/15/2019 CLINICAL DATA:  Flu-like symptoms for 2 days, initial encounter EXAM: CHEST - 2 VIEW COMPARISON:  12/08/2017 FINDINGS: Cardiac shadows within normal limits. The lungs are well aerated bilaterally. Increased lingular density is noted consistent with acute infiltrate. No sizable effusion is seen. No acute bony abnormality is noted. IMPRESSION: Lingular infiltrate. Followup PA and lateral chest X-ray is recommended in 3-4 weeks following trial of antibiotic therapy to ensure resolution and exclude underlying malignancy. Electronically Signed   By: Alcide Clever M.D.   On: 02/15/2019 09:30     CBC Recent Labs  Lab 02/15/19 0909 02/16/19 0439  WBC 13.9* 10.5  HGB  16.6 15.5  HCT 49.8 46.2  PLT 134* 113*  MCV 88.8 90.2  MCH 29.6 30.3  MCHC 33.3 33.5  RDW 13.2 13.0    Chemistries  Recent Labs  Lab 02/15/19 0909 02/16/19 0439  NA 133* 134*  K 3.5 3.3*  CL 96* 99  CO2 25 25  GLUCOSE 154* 219*  BUN 13 16  CREATININE 1.03 1.07  CALCIUM 8.5* 8.3*  AST 45*  --   ALT 18  --   ALKPHOS 44  --   BILITOT 1.4*  --    ------------------------------------------------------------------------------------------------------------------ No results for input(s): CHOL, HDL, LDLCALC, TRIG, CHOLHDL,  LDLDIRECT in the last 72 hours.  No results found for: HGBA1C ------------------------------------------------------------------------------------------------------------------ No results for input(s): TSH, T4TOTAL, T3FREE, THYROIDAB in the last 72 hours.  Invalid input(s): FREET3 ------------------------------------------------------------------------------------------------------------------ No results for input(s): VITAMINB12, FOLATE, FERRITIN, TIBC, IRON, RETICCTPCT in the last 72 hours.  Coagulation profile No results for input(s): INR, PROTIME in the last 168 hours.  No results for input(s): DDIMER in the last 72 hours.  Cardiac Enzymes No results for input(s): CKMB, TROPONINI, MYOGLOBIN in the last 168 hours.  Invalid input(s): CK ------------------------------------------------------------------------------------------------------------------ No results found for: BNP   Shon Hale M.D on 02/16/2019 at 6:13 PM  Go to [email or web address] - for contact info  [facility] - Office  [phone]

## 2019-02-17 LAB — GLUCOSE, CAPILLARY
Glucose-Capillary: 215 mg/dL — ABNORMAL HIGH (ref 70–99)
Glucose-Capillary: 245 mg/dL — ABNORMAL HIGH (ref 70–99)

## 2019-02-17 MED ORDER — PREDNISONE 20 MG PO TABS: 20.0000 mg | ORAL_TABLET | Freq: Every day | ORAL | 0 refills | Status: AC

## 2019-02-17 MED ORDER — ACETAMINOPHEN 325 MG PO TABS: 650.0000 mg | ORAL_TABLET | Freq: Four times a day (QID) | ORAL | 2 refills | Status: AC | PRN

## 2019-02-17 MED ORDER — AZITHROMYCIN 500 MG PO TABS: 500.0000 mg | ORAL_TABLET | Freq: Every day | ORAL | 0 refills | Status: AC

## 2019-02-17 MED ORDER — OSELTAMIVIR PHOSPHATE 75 MG PO CAPS: 75.0000 mg | ORAL_CAPSULE | Freq: Two times a day (BID) | ORAL | 0 refills | Status: AC

## 2019-02-17 MED ORDER — SODIUM CHLORIDE 0.9 % IV SOLN: INTRAVENOUS | Status: AC

## 2019-02-17 MED ORDER — GUAIFENESIN ER 600 MG PO TB12: 600.0000 mg | ORAL_TABLET | Freq: Two times a day (BID) | ORAL | 0 refills | Status: AC

## 2019-02-17 MED ORDER — CEPHALEXIN 500 MG PO CAPS: 500.0000 mg | ORAL_CAPSULE | Freq: Three times a day (TID) | ORAL | 0 refills | Status: AC

## 2019-02-17 NOTE — Care Management (Signed)
[place] was notified on 02/16/19 of admission date 02/15/19.

## 2019-02-17 NOTE — Discharge Summary (Signed)
Ricardo Maldonado, is a 63 y.o. male  DOB 13-Oct-1956  MRN [id number].  Admission date:  02/15/2019  Admitting Physician  Pratik Hoover Brunette, DO  Discharge Date:  02/17/2019   [facility] MD  [facility]  Recommendations for primary care physician for things to follow:   1) Avoid Tobacco exposure 2) Take medications as prescribed 3) May return to work on Monday, March 9th 4) Repeat chest x-ray in 4 to 6 weeks advised with primary care physician   Admission Diagnosis  Community acquired pneumonia, unspecified laterality [J18.9]   Discharge Diagnosis  Community acquired pneumonia, unspecified laterality [J18.9]    Principal Problem:   Influenza A Active Problems:   Acute hypoxemic respiratory failure (HCC)   COPD with acute exacerbation (HCC)   Community acquired pneumonia   Hyponatremia      Past Medical History:  Diagnosis Date  . Bronchitis, chronic (HCC)   . Chronic neck pain   . Chronic shoulder pain   . COPD (chronic obstructive pulmonary disease) (HCC)   . Diabetes mellitus   . Emphysema   . Emphysema of lung (HCC)   . Hypertension     Past Surgical History:  Procedure Laterality Date  . CARDIAC CATHETERIZATION    . COLONOSCOPY    . HERNIA REPAIR         HPI  from the history and physical done on the day of admission:    PCP: [facility]   Patient coming from: Home  Chief Complaint: Worsening dyspnea and cough  HPI: Ricardo Maldonado is a 63 y.o. male with medical history significant for hypertension, diabetes, dyslipidemia, and COPD who presented to the ED with worsening shortness of breath as well as cough productive of yellowish sputum that began about 3 days ago.  He is worried about having the flu and has had some mild fevers, chills, myalgias as well.  He denies any history of smoking and has not had any sick contacts.  He has had some nausea on  account of fits of coughing, but no abdominal pain or diarrhea noted.  He does not have any inhalers at home that he was using for symptoms.   ED Course: Vital signs demonstrated elevated pulse of 106 and temperature of 99.1.  He was noted to have a saturation of 86% on room air and therefore has been placed on 3 L nasal cannula with saturation in the low 90th percentile.  Two-view chest x-ray demonstrates some lingular infiltrate and white blood cell count is 13.9 and he is noted to have sodium of 133.  He has been given a breathing treatment and has been started on azithromycin and Rocephin with some improvement noted.  Influenza swab has not been collected, unfortunately   Hospital Course:      Brief Summary 63 y.o.malewith medical history significant forhypertension, diabetes, dyslipidemia, and COPD admitted on 02/15/2019 with influenza A and acute hypoxic respiratory failure, hypoxia resolved   Plan:- 1)Influenza A----fevers have resolved, hypoxia has resolved, respiratory  symptoms continue to improve, okay to discharge home on Tamiflu,  continue supportive and symptomatic treatment   2)Acute hypoxic respiratory failure--- #1 above, weaned off oxygen,  treated with Rocephin and azithromycin for possible concomitant community-acquired pneumonia.... Okay to discharge on Keflex and azithromycin, repeat chest x-ray in 4 to 6 weeks advised,  Hypoxia has resolved,  3) acute COPD exacerbation--2/2 to #1 above,  treated with IV Solu-Medrol, okay to discharge on prednisone, and mucolytics as ordered  4)HTN--stable, continue atenolol  5) hyperglycemia --- should improve now that patient is off IV Solu-Medrol    Code Status : full  Family Communication:   na  Disposition Plan  : home  Consults  :  na  Discharge Condition: Stable  Follow UP--- repeat chest x-ray in 4 to 6 weeks with PCP  Diet and Activity recommendation:  As advised  Discharge Instructions    Discharge  Instructions    Call MD for:  difficulty breathing, headache or visual disturbances   Complete by:  As directed    Call MD for:  persistant dizziness or light-headedness   Complete by:  As directed    Call MD for:  persistant nausea and vomiting   Complete by:  As directed    Call MD for:  severe uncontrolled pain   Complete by:  As directed    Call MD for:  temperature >100.4   Complete by:  As directed    Diet - low sodium heart healthy   Complete by:  As directed    Discharge instructions   Complete by:  As directed    1) Avoid Tobacco exposure 2) Take medications as prescribed 3) May return to work on Monday, March 9th 4) Repeat chest x-ray in 4 to 6 weeks advised with primary care physician   Increase activity slowly   Complete by:  As directed         Discharge Medications     Allergies as of 02/17/2019   No Known Allergies     Medication List    TAKE these medications   acetaminophen 325 MG tablet Commonly known as:  TYLENOL Take 2 tablets (650 mg total) by mouth every 6 (six) hours as needed for mild pain (or Fever >/= 101).   albuterol 108 (90 Base) MCG/ACT inhaler Commonly known as:  PROVENTIL HFA;VENTOLIN HFA Inhale 1-2 puffs into the lungs every 6 (six) hours as needed for wheezing or shortness of breath.   atenolol 25 MG tablet Commonly known as:  TENORMIN Take 25 mg by mouth daily.   azithromycin 500 MG tablet Commonly known as:  ZITHROMAX Take 1 tablet (500 mg total) by mouth daily for 5 days.   budesonide-formoterol 160-4.5 MCG/ACT inhaler Commonly known as:  SYMBICORT Inhale 2 puffs into the lungs 2 (two) times daily.   cephALEXin 500 MG capsule Commonly known as:  KEFLEX Take 1 capsule (500 mg total) by mouth 3 (three) times daily for 5 days.   Cinnamon 500 MG capsule Take 500 mg by mouth 2 (two) times daily.   cyclobenzaprine 10 MG tablet Commonly known as:  FLEXERIL Take 1 tablet (10 mg total) by mouth 3 (three) times daily as  needed.   guaiFENesin 600 MG 12 hr tablet Commonly known as:  MUCINEX Take 1 tablet (600 mg total) by mouth 2 (two) times daily for 10 days.   loratadine 10 MG tablet Commonly known as:  CLARITIN Take 10 mg by mouth daily.   oseltamivir 75 MG capsule Commonly  known as:  TAMIFLU Take 1 capsule (75 mg total) by mouth 2 (two) times daily for 4 days.   oxyCODONE-acetaminophen 5-325 MG tablet Commonly known as:  PERCOCET/ROXICET Take 1 tablet by mouth every 6 (six) hours as needed for severe pain.   predniSONE 20 MG tablet Commonly known as:  DELTASONE Take 1 tablet (20 mg total) by mouth daily with breakfast.   promethazine 25 MG tablet Commonly known as:  PHENERGAN Take 25 mg by mouth every 6 (six) hours as needed. Nausea/vomiting       Major procedures and Radiology Reports - PLEASE review detailed and final reports for all details, in brief -    Dg Chest 2 View  Result Date: 02/15/2019 CLINICAL DATA:  Flu-like symptoms for 2 days, initial encounter EXAM: CHEST - 2 VIEW COMPARISON:  12/08/2017 FINDINGS: Cardiac shadows within normal limits. The lungs are well aerated bilaterally. Increased lingular density is noted consistent with acute infiltrate. No sizable effusion is seen. No acute bony abnormality is noted. IMPRESSION: Lingular infiltrate. Followup PA and lateral chest X-ray is recommended in 3-4 weeks following trial of antibiotic therapy to ensure resolution and exclude underlying malignancy. Electronically Signed   By: Alcide Clever M.D.   On: 02/15/2019 09:30    Micro Results    Recent Results (from the past 240 hour(s))  MRSA PCR Screening     Status: None   Collection Time: 02/16/19  5:06 AM  Result Value Ref Range Status   MRSA by PCR NEGATIVE NEGATIVE Final    Comment:        The GeneXpert MRSA Assay (FDA approved for NASAL specimens only), is one component of a comprehensive MRSA colonization surveillance program. It is not intended to diagnose  MRSA infection nor to guide or monitor treatment for MRSA infections. Performed at [facility], [address], [place], [place] [address]     Today   Subjective    Ricardo Maldonado today has no complaints, O2 sat mid 90s on room air, cough and congestion improving, no fevers          Patient has been seen and examined prior to discharge   Objective   Blood pressure 117/75, pulse 83, temperature (!) 97.4 F (36.3 C), temperature source Oral, resp. rate 19, height 6\' 4"  (1.93 m), weight 92.5 kg, SpO2 (!) 89 %.   Intake/Output Summary (Last 24 hours) at 02/17/2019 1107 Last data filed at 02/17/2019 1059 Gross per 24 hour  Intake 600 ml  Output 950 ml  Net -350 ml    Exam Gen:- Awake Alert,  In no apparent distress  HEENT:- [place].AT, No sclera icterus Neck-Supple Neck,No JVD,.  Lungs-improved air movement, no wheezing, few scattered rhonchi on the left  CV- S1, S2 normal, regular  Abd-  +ve B.Sounds, Abd Soft, No tenderness,    Extremity/Skin:- No  edema, pedal pulses present  Psych-affect is appropriate, oriented x3 Neuro-no new focal deficits, no tremors   Data Review   CBC w Diff:  Lab Results  Component Value Date   WBC 10.5 02/16/2019   HGB 15.5 02/16/2019   HCT 46.2 02/16/2019   PLT 113 (L) 02/16/2019   LYMPHOPCT 20 06/02/2014   MONOPCT 8 06/02/2014   EOSPCT 3 06/02/2014   BASOPCT 1 06/02/2014    CMP:  Lab Results  Component Value Date   NA 134 (L) 02/16/2019   K 3.3 (L) 02/16/2019   CL 99 02/16/2019   CO2 25 02/16/2019   BUN 16 02/16/2019  CREATININE 1.07 02/16/2019   PROT 7.8 02/15/2019   ALBUMIN 4.2 02/15/2019   BILITOT 1.4 (H) 02/15/2019   ALKPHOS 44 02/15/2019   AST 45 (H) 02/15/2019   ALT 18 02/15/2019  .   Total Discharge time is about 33 minutes  Shon Hale M.D on 02/17/2019 at 11:07 AM  Go to [email or web address] -  for contact info  [facility] - Office  [phone]

## 2019-02-17 NOTE — Discharge Instructions (Signed)
1) Avoid Tobacco exposure 2) Take medications as prescribed 3) May return to work on Monday, March 9th 4) Repeat chest x-ray in 4 to 6 weeks advised with primary care physician

## 2019-02-18 LAB — LEGIONELLA PNEUMOPHILA SEROGP 1 UR AG: L. pneumophila Serogp 1 Ur Ag: NEGATIVE

## 2019-05-28 IMAGING — DX DG LUMBAR SPINE COMPLETE 4+V
5 series · 6 of 6 positions shown · non-contrast
Comparison: None.

CLINICAL DATA: Low back pain

EXAM:
LUMBAR SPINE - COMPLETE 4+ VIEW

[Series 1: l-spine ap · 0.14mm/px · 2 of 2 slices shown]
[im 1/2]
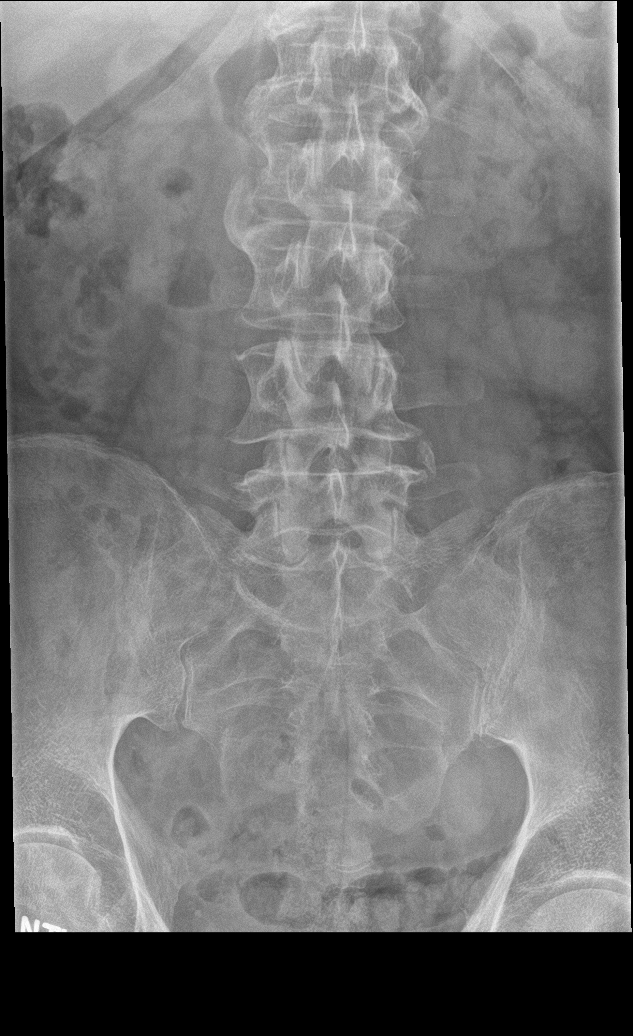
[im 2/2]
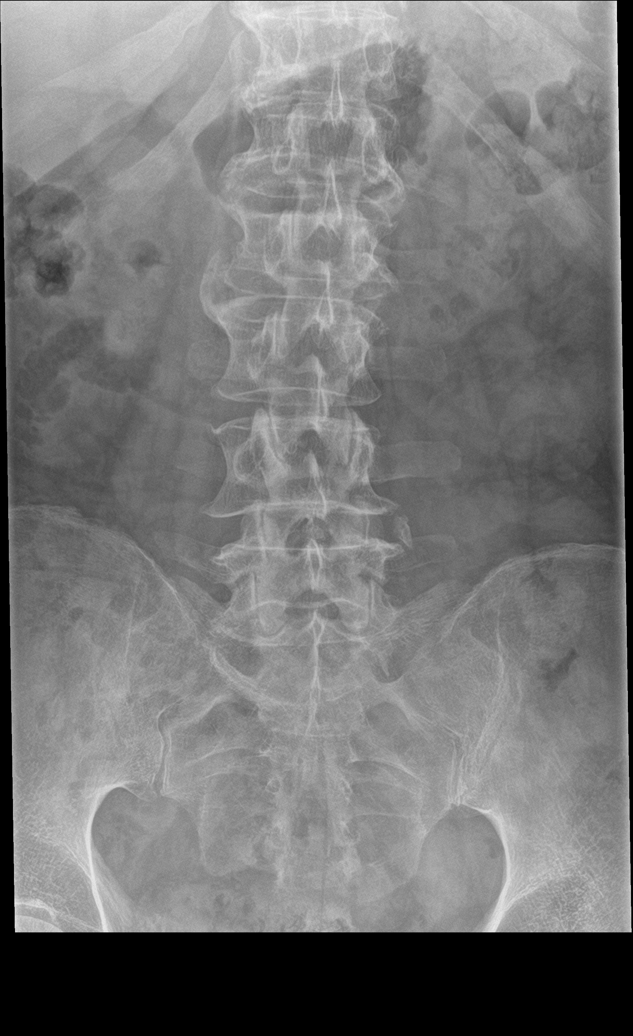

[l-spine obl (1 of 2)]
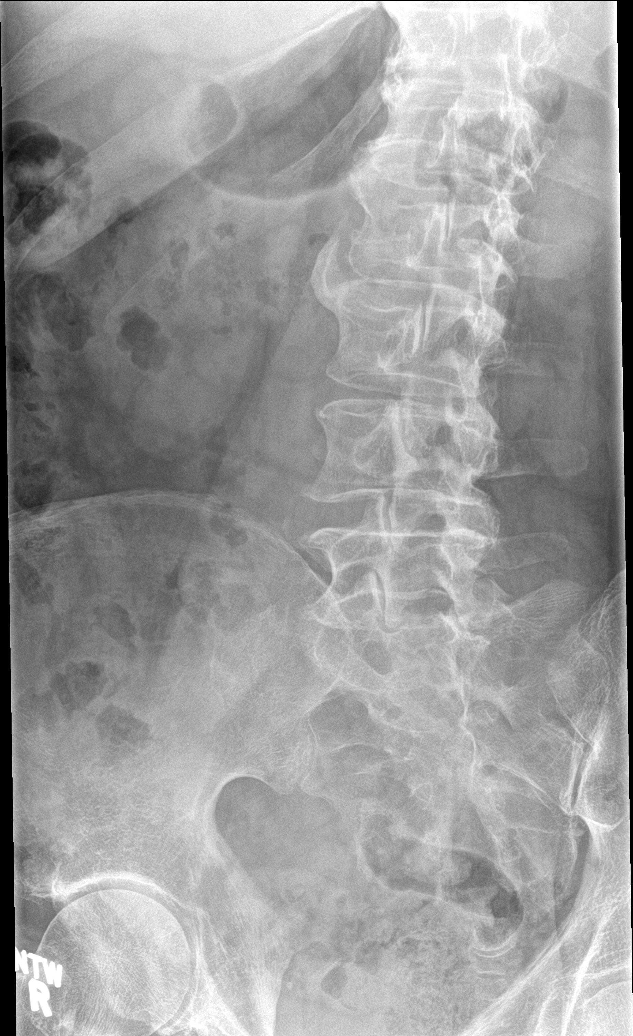

[l-spine obl (2 of 2)]
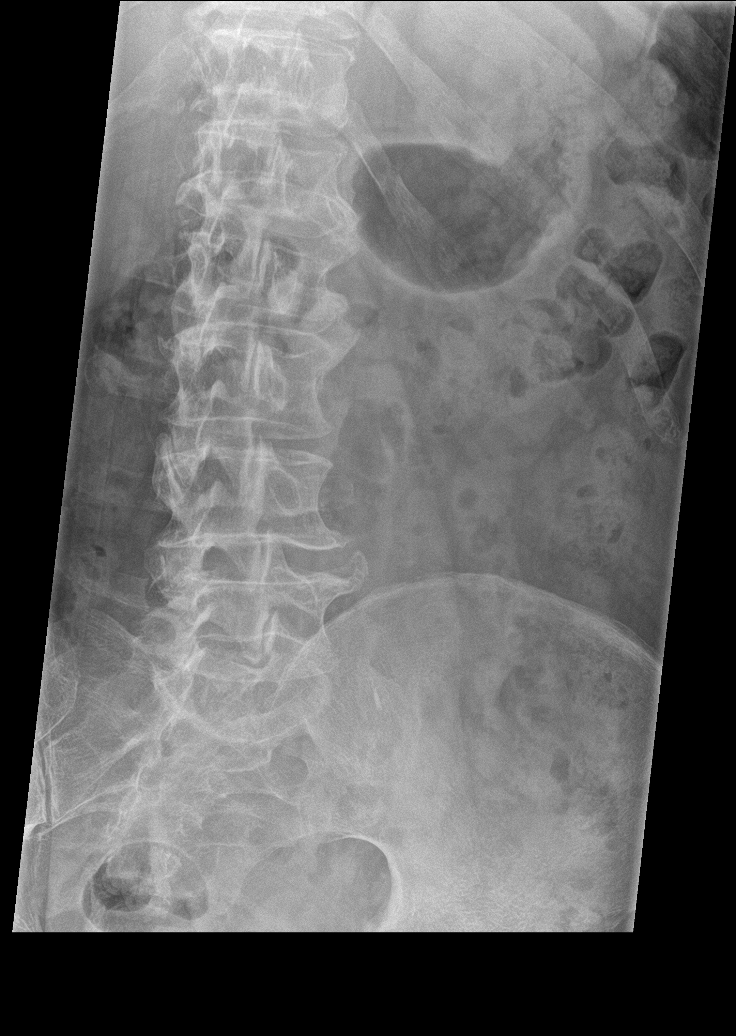

[l-spine lat]
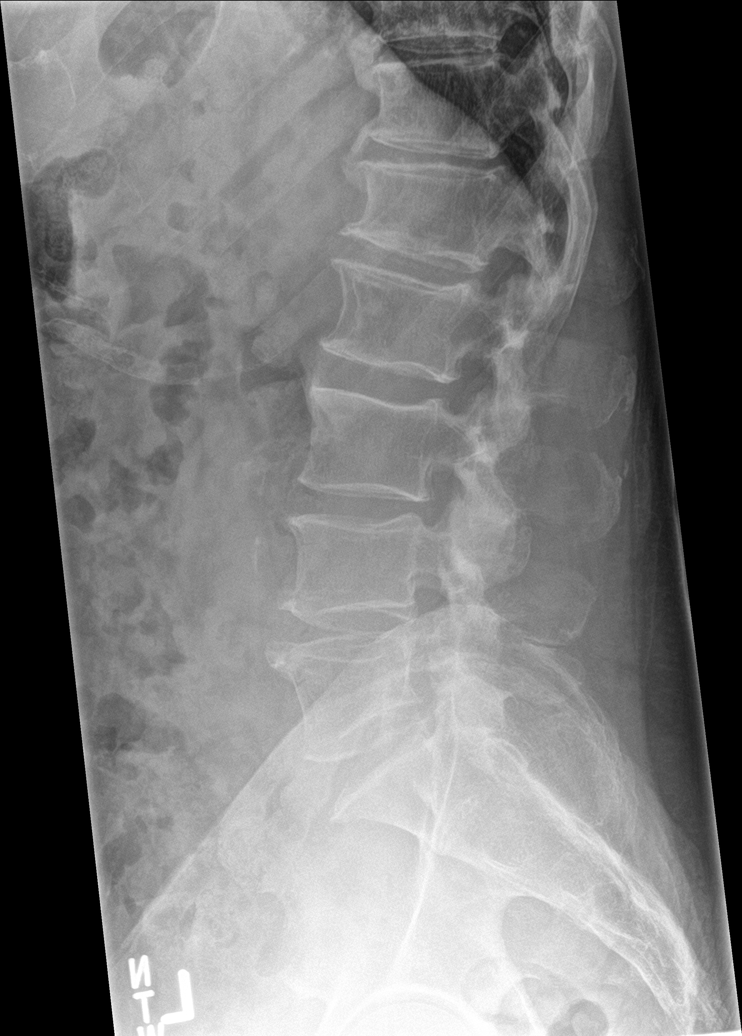

[l-spine spot]
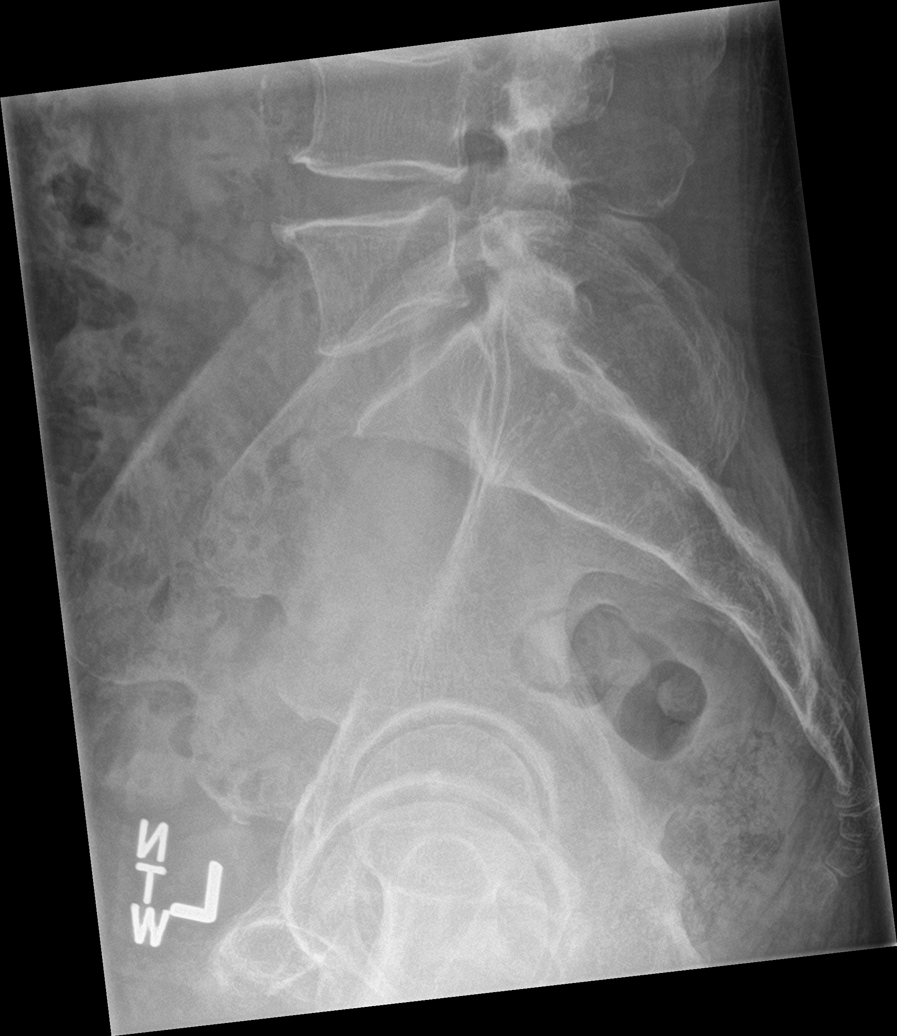

[6 of 6 positions shown; findings below may reference images not displayed]

FINDINGS: Mild degenerative spurring throughout the lumbar spine. Normal
alignment. Disc spaces are maintained. No fracture. SI joints are
symmetric and unremarkable.
IMPRESSION: No acute bony abnormality.

## 2024-06-24 ENCOUNTER — Other Ambulatory Visit (HOSPITAL_COMMUNITY): Payer: Self-pay | Admitting: Internal Medicine

## 2024-06-24 DIAGNOSIS — R911 Solitary pulmonary nodule: Secondary | ICD-10-CM

## 2024-07-09 ENCOUNTER — Ambulatory Visit (HOSPITAL_COMMUNITY): Admission: RE | Admit: 2024-07-09 | Source: Ambulatory Visit

## 2024-07-09 ENCOUNTER — Encounter (HOSPITAL_COMMUNITY): Payer: Self-pay
